# Patient Record
Sex: Male | Born: 1955 | Race: Black or African American | Hispanic: No | Marital: Married | Smoking: Current every day smoker
Health system: Southern US, Community
[De-identification: ages and names within clinical notes are randomized; demographics above are authoritative.]

## PROBLEM LIST (undated history)

## (undated) DIAGNOSIS — R200 Anesthesia of skin: Secondary | ICD-10-CM

## (undated) DIAGNOSIS — M1990 Unspecified osteoarthritis, unspecified site: Secondary | ICD-10-CM

## (undated) DIAGNOSIS — I10 Essential (primary) hypertension: Secondary | ICD-10-CM

## (undated) HISTORY — PX: BACK SURGERY: SHX140

## (undated) HISTORY — PX: COLONOSCOPY W/ BIOPSIES AND POLYPECTOMY: SHX1376

## (undated) HISTORY — PX: KNEE ARTHROSCOPY: SUR90

---
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
## 1980-11-15 HISTORY — PX: KNEE SURGERY: SHX244

## 2009-08-04 ENCOUNTER — Ambulatory Visit: Payer: Self-pay | Admitting: Gastroenterology

## 2010-03-12 ENCOUNTER — Ambulatory Visit: Payer: Self-pay | Admitting: Family Medicine

## 2012-02-15 ENCOUNTER — Ambulatory Visit: Payer: Self-pay | Admitting: Internal Medicine

## 2012-04-06 ENCOUNTER — Ambulatory Visit: Payer: Self-pay | Admitting: Pain Medicine

## 2012-04-24 ENCOUNTER — Emergency Department: Payer: Self-pay | Admitting: Emergency Medicine

## 2012-04-24 LAB — BASIC METABOLIC PANEL
Anion Gap: 11 (ref 7–16)
BUN: 21 mg/dL — ABNORMAL HIGH (ref 7–18)
Chloride: 101 mmol/L (ref 98–107)
Co2: 24 mmol/L (ref 21–32)
Creatinine: 1.41 mg/dL — ABNORMAL HIGH (ref 0.60–1.30)
Osmolality: 276 (ref 275–301)
Potassium: 4.3 mmol/L (ref 3.5–5.1)

## 2012-04-24 LAB — CK TOTAL AND CKMB (NOT AT ARMC): CK-MB: 2.9 ng/mL (ref 0.5–3.6)

## 2012-06-13 ENCOUNTER — Ambulatory Visit: Payer: Self-pay | Admitting: Pain Medicine

## 2012-08-14 ENCOUNTER — Ambulatory Visit: Payer: Self-pay | Admitting: Pain Medicine

## 2012-09-07 ENCOUNTER — Ambulatory Visit: Payer: Self-pay | Admitting: Pain Medicine

## 2013-05-31 ENCOUNTER — Ambulatory Visit: Payer: Self-pay | Admitting: Physical Medicine and Rehabilitation

## 2013-07-12 ENCOUNTER — Other Ambulatory Visit: Payer: Self-pay | Admitting: Neurosurgery

## 2013-07-30 ENCOUNTER — Encounter (HOSPITAL_COMMUNITY): Payer: Self-pay | Admitting: Pharmacy Technician

## 2013-07-31 ENCOUNTER — Encounter (HOSPITAL_COMMUNITY)
Admission: RE | Admit: 2013-07-31 | Discharge: 2013-07-31 | Disposition: A | Discharge status: Home / Self Care | Payer: 59 | Source: Ambulatory Visit | Attending: Anesthesiology | Admitting: Anesthesiology

## 2013-07-31 ENCOUNTER — Encounter (HOSPITAL_COMMUNITY)
Admission: RE | Admit: 2013-07-31 | Discharge: 2013-07-31 | Disposition: A | Discharge status: Home / Self Care | Payer: 59 | Source: Ambulatory Visit | Attending: Neurosurgery | Admitting: Neurosurgery

## 2013-07-31 ENCOUNTER — Encounter (HOSPITAL_COMMUNITY): Payer: Self-pay

## 2013-07-31 DIAGNOSIS — Z01810 Encounter for preprocedural cardiovascular examination: Secondary | ICD-10-CM | POA: Insufficient documentation

## 2013-07-31 DIAGNOSIS — Z01818 Encounter for other preprocedural examination: Secondary | ICD-10-CM | POA: Insufficient documentation

## 2013-07-31 DIAGNOSIS — Z01812 Encounter for preprocedural laboratory examination: Secondary | ICD-10-CM | POA: Insufficient documentation

## 2013-07-31 HISTORY — DX: Essential (primary) hypertension: I10

## 2013-07-31 HISTORY — DX: Anesthesia of skin: R20.0

## 2013-07-31 LAB — TYPE AND SCREEN
ABO/RH(D): O POS
Antibody Screen: NEGATIVE

## 2013-07-31 LAB — BASIC METABOLIC PANEL
Calcium: 9.3 mg/dL (ref 8.4–10.5)
Creatinine, Ser: 2.39 mg/dL — ABNORMAL HIGH (ref 0.50–1.35)
GFR calc Af Amer: 33 mL/min — ABNORMAL LOW (ref >90)
GFR calc non Af Amer: 29 mL/min — ABNORMAL LOW (ref >90)
Sodium: 136 mEq/L (ref 135–145)

## 2013-07-31 LAB — CBC
Platelets: 243 10*3/uL (ref 150–400)
RBC: 3.52 MIL/uL — ABNORMAL LOW (ref 4.22–5.81)
RDW: 12.7 % (ref 11.5–15.5)
WBC: 7.8 10*3/uL (ref 4.0–10.5)

## 2013-07-31 LAB — SURGICAL PCR SCREEN
MRSA, PCR: NEGATIVE
Staphylococcus aureus: NEGATIVE

## 2013-07-31 LAB — ABO/RH: ABO/RH(D): O POS

## 2013-07-31 NOTE — Pre-Procedure Instructions (Signed)
Zyere Umbaugh  07/31/2013   Your procedure is scheduled on:  Wednesday, August 08, 2013  Report to [facility] Short Stay Center at 9:45 AM.  Call this number if you have problems the morning of surgery: [phone]   Remember:   Do not eat food or drink liquids after midnight.   Take these medicines the morning of surgery with A SIP OF WATER: If needed:oxycodone (ROXICODONE) 30 MG immediate release tablet Stop taking Aspirin and herbal medications. Do not take any NSAIDs ie: Ibuprofen, Advil, Naproxen or any medication containing Aspirin.  Do not wear jewelry, make-up or nail polish.  Do not wear lotions, powders, or perfumes. You may wear deodorant.  Do not shave 48 hours prior to surgery. Men may shave face and neck.  Do not bring valuables to the hospital.  [facility] is not responsible for any belongings or valuables.  Contacts, dentures or bridgework may not be worn into surgery.  Leave suitcase in the car. After surgery it may be brought to your room.  For patients admitted to the hospital, checkout time is 11:00 AM the day of discharge.   Patients discharged the day of surgery will not be allowed to drive home.  Name and phone number of your driver:   Special Instructions: Shower using CHG 2 nights before surgery and the night before surgery.  If you shower the day of surgery use CHG.  Use special wash - you have one bottle of CHG for all showers.  You should use approximately 1/3 of the bottle for each shower.   Please read over the following fact sheets that you were given: Pain Booklet, Coughing and Deep Breathing, Blood Transfusion Information, MRSA Information and Surgical Site Infection Prevention

## 2013-07-31 NOTE — Progress Notes (Signed)
Pt denies SOB, chest pain, and being under the care of a cardiologist. Pt denies having a chest x ray and EKG within the last year. Pt states that he had a stress test in 2012 at [facility];results were requested along with latest office notes, any EKG and chest x ray results. Pt had a positive result on the STOP-BANG Assessment Tool. Dr. Dareen Piano (PCP) of [facility] was notified of results.

## 2013-07-31 NOTE — Progress Notes (Signed)
07/31/13 0938  OBSTRUCTIVE SLEEP APNEA  Have you ever been diagnosed with sleep apnea through a sleep study? No  Do you snore loudly (loud enough to be heard through closed doors)?  1  Do you often feel tired, fatigued, or sleepy during the daytime? 1  Has anyone observed you stop breathing during your sleep? 0  Do you have, or are you being treated for high blood pressure? 1  BMI more than 35 kg/m2? 0  Age over 55 years old? 1  Neck circumference greater than 40 cm/18 inches? 0  Gender: 1  Obstructive Sleep Apnea Score 5  Score 4 or greater  Results sent to PCP

## 2013-08-01 ENCOUNTER — Encounter (HOSPITAL_COMMUNITY): Payer: Self-pay | Admitting: Vascular Surgery

## 2013-08-01 NOTE — Progress Notes (Signed)
Anesthesia Chart Review:  Patient is a 57 year old male scheduled for L4-5 PLIF on 08/08/13 by Dr. Jeral Fruit.  History includes smoking, HTN.  PCP is Dr. Einar Crow at [facility].  Meds: Lisinopril/HCTZ, oxycodone.    EKG on 07/31/13 showed SB @ 52 bpm.  Nuclear stress test on 02/15/12 [facility]) showed negative scan, EF 70%.  CXR on 07/31/13 showed no acute abnormality noted.  Preoperative labs noted.  BUN/Cr 32/2.39.  I received comparison labs from Dr. Ewell Poe office from 04/23/13 that showed a BUN/Cr of 18/1.2.  With his Cr doubling in three months, I think he will need medical clearance.  I notified Jessica at Dr. Cassandria Santee office.  She will notify Dr. Ewell Poe office of lab results and request recommendations for clearance.  If he has not had a BMET repeated then he will need one repeated preoperatively.  I'll follow-up once I receive additional information/recommendations.  Velna Ochs [facility] Short Stay Center/Anesthesiology Phone [phone] 08/02/2013 9:24 AM

## 2013-08-08 ENCOUNTER — Encounter (HOSPITAL_COMMUNITY): Admission: RE | Payer: Self-pay | Source: Ambulatory Visit

## 2013-08-08 ENCOUNTER — Inpatient Hospital Stay (HOSPITAL_COMMUNITY): Admission: RE | Admit: 2013-08-08 | Payer: 59 | Source: Ambulatory Visit | Admitting: Neurosurgery

## 2013-08-08 SURGERY — POSTERIOR LUMBAR FUSION 1 LEVEL
Anesthesia: General

## 2014-03-05 ENCOUNTER — Ambulatory Visit: Payer: Self-pay | Admitting: Nephrology

## 2014-05-22 ENCOUNTER — Emergency Department: Payer: Self-pay | Admitting: Emergency Medicine

## 2014-05-22 LAB — COMPREHENSIVE METABOLIC PANEL
ALBUMIN: 3.7 g/dL (ref 3.4–5.0)
AST: 27 U/L (ref 15–37)
Alkaline Phosphatase: 68 U/L
Anion Gap: 6 — ABNORMAL LOW (ref 7–16)
BILIRUBIN TOTAL: 0.6 mg/dL (ref 0.2–1.0)
BUN: 16 mg/dL (ref 7–18)
CALCIUM: 8.6 mg/dL (ref 8.5–10.1)
CO2: 28 mmol/L (ref 21–32)
CREATININE: 1.15 mg/dL (ref 0.60–1.30)
Chloride: 103 mmol/L (ref 98–107)
EGFR (Non-African Amer.): >60
GLUCOSE: 123 mg/dL — AB (ref 65–99)
Osmolality: 276 (ref 275–301)
POTASSIUM: 4 mmol/L (ref 3.5–5.1)
SGPT (ALT): 39 U/L (ref 12–78)
Sodium: 137 mmol/L (ref 136–145)
Total Protein: 7.9 g/dL (ref 6.4–8.2)

## 2014-05-22 LAB — TSH: THYROID STIMULATING HORM: 0.9 u[IU]/mL

## 2014-05-22 LAB — CBC
HCT: 45.7 % (ref 40.0–52.0)
HGB: 14.9 g/dL (ref 13.0–18.0)
MCH: 34 pg (ref 26.0–34.0)
MCHC: 32.6 g/dL (ref 32.0–36.0)
MCV: 104 fL — AB (ref 80–100)
PLATELETS: 257 10*3/uL (ref 150–440)
RBC: 4.38 10*6/uL — ABNORMAL LOW (ref 4.40–5.90)
RDW: 12.7 % (ref 11.5–14.5)
WBC: 7.2 10*3/uL (ref 3.8–10.6)

## 2014-05-22 LAB — T4, FREE: Free Thyroxine: 0.95 ng/dL (ref 0.76–1.46)

## 2014-05-22 LAB — LIPASE, BLOOD: Lipase: 141 U/L (ref 73–393)

## 2014-05-22 LAB — TROPONIN I: Troponin-I: <0.02 ng/mL

## 2014-07-12 ENCOUNTER — Ambulatory Visit: Payer: Self-pay | Admitting: Gastroenterology

## 2014-07-15 LAB — PATHOLOGY REPORT

## 2014-12-16 ENCOUNTER — Ambulatory Visit (INDEPENDENT_AMBULATORY_CARE_PROVIDER_SITE_OTHER): Payer: 59 | Admitting: Neurology

## 2014-12-16 ENCOUNTER — Encounter: Payer: Self-pay | Admitting: Neurology

## 2014-12-16 VITALS — BP 138/82 | HR 75 | Ht 74.0 in | Wt 220.0 lb

## 2014-12-16 DIAGNOSIS — M4806 Spinal stenosis, lumbar region: Secondary | ICD-10-CM

## 2014-12-16 DIAGNOSIS — M5416 Radiculopathy, lumbar region: Secondary | ICD-10-CM | POA: Insufficient documentation

## 2014-12-16 DIAGNOSIS — M48062 Spinal stenosis, lumbar region with neurogenic claudication: Secondary | ICD-10-CM

## 2014-12-16 MED ORDER — GABAPENTIN 300 MG PO CAPS: 300.0000 mg | ORAL_CAPSULE | Freq: Three times a day (TID) | ORAL | Status: DC

## 2014-12-16 NOTE — Patient Instructions (Signed)
Overall you are doing fairly well but I do want to suggest a few things today:   Remember to drink plenty of fluid, eat healthy meals and do not skip any meals. Try to eat protein with a every meal and eat a healthy snack such as fruit or nuts in between meals. Try to keep a regular sleep-wake schedule and try to exercise daily, particularly in the form of walking, 20-30 minutes a day, if you can.   As far as your medications are concerned, I would like to suggest: Neurontin 300mg  three times a day, may cause drowsiness. May also consider epidural lumbar injections after imaging  As far as diagnostic testing: MRi of the Lumbar spine, Physical Therapy  My clinical assistant and will answer any of your questions and relay your messages to me and also relay most of my messages to you.   Our phone number is [phone]. We also have an after hours call service for urgent matters and there is a physician on-call for urgent questions. For any emergencies you know to call 911 or go to the nearest emergency room

## 2014-12-16 NOTE — Progress Notes (Addendum)
[facility]    Provider:  Dr Lucia GaskinsAhern Referring Provider: Lauro RegulusAnderson, Marshall W, MD Primary Care Physician:  Pcp Not In System  CC:  Back pain  HPI:  [name] Maudry MayhewLeath Jr. is a 59 y.o. male here as a referral from Dr. Dareen PianoAnderson for spinal stenosis. PMHx HTN,CKD,  lumbar radiculopathy, spinal stenosis. For the last 5 years he has had worsening back pain. He was seen by Dr. Jeral FruitBotero and was hoping to get surgery but insurance declined. He is having back spasms, like someone took a wire brush and put it in his rectum, numbness around the buttocks and genitalia. He has pain that radiates into the posterior thighs, like a jolt. Worse at work because he is bending up and down. Worse when sitting too long, has to get up and walk. Right leg worse than the left. Worse with valsalva. Dr. Jeral FruitBotero gave him Oxy 10 and that didn't really hel[p. Nothing seems to help, tried advil and tylenol. Pain gets to "12/10" and is daily, some days he can't even make it to work or he has to leave early due to back pain. No urinary or bowel incontinence or retension. No inciting factors, no trauma or falls, started slowly and progressed slowly. He can't walk more than a block or he needs to sit down. Has had injections, pain control without relief.   Reviewed notes, labs and imaging from outside physicians, which showed: CBC with mild anemia 12.1/34.8, BMP with creatinine 2.39, BUN 32. Per notes, he was scheduled for L4-L5 PLIF in 07/2013 by Dr. Jeral FruitBotero. Notes state that creatinine of 2.39 in 2014 was double from previous labs and they requested medical clearance. Nuclear stress test 02/2012 negative scan, EF 70%, cxr 07/2013 without abnormality. No further notes or information to state why he did not proceed with surgery however patient reports it was an insurnce problem.    Review of Systems: Patient complains of symptoms per HPI as well as the following symptoms: rash, no SOB, no CP. Pertinent negatives per HPI. All  others negative.   History   Social History  . Marital Status: Single    Spouse Name: N/A    Number of Children: 2  . Years of Education: College 1y   Occupational History  .  Other    [employer]   Social History Main Topics  . Smoking status: Current Some Day Smoker -- 3.00 packs/day    Types: Cigars, Cigarettes  . Smokeless tobacco: Never Used     Comment: 6 pack on weekends   . Alcohol Use: 0.0 oz/week    0 Not specified per week     Comment: "occasional beer"  . Drug Use: No  . Sexual Activity: Not on file   Other Topics Concern  . Not on file   Social History Narrative   Lives at home with a friend.    Work at [employer].    1 year of college.   Has 2 children.   Caffeine Use:     Family History  Problem Relation Age of Onset  . Sickle cell trait Sister   . High blood pressure Mother   . Cancer Father     Past Medical History  Diagnosis Date  . Hypertension   . Numbness     Hx: of left hand third digit    Past Surgical History  Procedure Laterality Date  . Colonoscopy w/ biopsies and polypectomy      Hx; of  . Knee surgery Left  1978  . Knee surgery Right 1982    Current Outpatient Prescriptions  Medication Sig Dispense Refill  . Cholecalciferol 1000 UNITS capsule Take 1 capsule by mouth daily.    Marland Kitchen lisinopril-hydrochlorothiazide (PRINZIDE,ZESTORETIC) 20-12.5 MG per tablet Take 1 tablet by mouth daily.    . tadalafil (CIALIS) 20 MG tablet Take 1 tablet by mouth daily.    Marland Kitchen gabapentin (NEURONTIN) 300 MG capsule Take 1 capsule (300 mg total) by mouth 3 (three) times daily. 90 capsule 11   No current facility-administered medications for this visit.    Allergies as of 12/16/2014  . (No Known Allergies)    Vitals: BP 138/82 mmHg  Pulse 75  Ht  (1.88 m)  Wt 220 lb (99.791 kg)  BMI 28.23 kg/m2 Last Weight:  Wt Readings from Last 1 Encounters:  12/16/14 220 lb (99.791 kg)   Last Height:   Ht Readings from Last 1  Encounters:  12/16/14  (1.88 m)   Physical exam: Exam: Gen: NAD, conversant, well nourised,                      CV: RRR, no MRG. No Carotid Bruits. No peripheral edema, warm, nontender Eyes: Conjunctivae clear without exudates or hemorrhage  Neuro: Detailed Neurologic Exam  Speech:    Speech is normal; fluent and spontaneous with normal comprehension.  Cognition:    The patient is oriented to person, place, and time;     recent and remote memory intact;     language fluent;     normal attention, concentration,     fund of knowledge Cranial Nerves:    The pupils are equal, round, and reactive to light. The fundi are normal and spontaneous venous pulsations are present. Visual fields are full to finger confrontation. Extraocular movements are intact. Trigeminal sensation is intact and the muscles of mastication are normal. The face is symmetric. The palate elevates in the midline. Hearing intact. Voice is normal. Shoulder shrug is normal. The tongue has normal motion without fasciculations.   Coordination:    Normal finger to nose and heel to shin.   Gait:    Heel-toe and tandem gait are normal.   Motor Observation:    No asymmetry, no atrophy, and no involuntary movements noted. Tone:    Normal muscle tone.    Posture:    Posture is normal. normal erect    Strength:    Strength is V/V in the upper and lower limbs.      Sensation: intact to LT     Reflex Exam:  DTR's: Hyporeflexic achilles otherwise deep tendon reflexes in the upper and lower extremities are normal bilaterally.   Toes:    The toes are equivocal bilaterally.   Clonus:    Clonus is absent.    Assessment/Plan:  59 year old male with HTN, Lumbar radiculopathy with neurogenic claudication. Worsening, right > left leg. Last MRI L spine 2014.   Repeat MRI of the L spine Neurontin prn Physical Therapy Patient had MRI of the L-spine done at outside facility in 2014, have requested medical records  and report  Naomie Dean, MD  [facility] [address] [place], [place] [address]  Phone [phone] Fax [phone]

## 2014-12-19 ENCOUNTER — Telehealth: Payer: Self-pay | Admitting: Neurology

## 2014-12-19 ENCOUNTER — Other Ambulatory Visit: Payer: Self-pay | Admitting: Neurology

## 2014-12-19 DIAGNOSIS — Z79899 Other long term (current) drug therapy: Secondary | ICD-10-CM

## 2014-12-19 DIAGNOSIS — N289 Disorder of kidney and ureter, unspecified: Secondary | ICD-10-CM

## 2014-12-19 NOTE — Telephone Encounter (Signed)
Great.  Thanks

## 2014-12-19 NOTE — Telephone Encounter (Signed)
Called patient. Left vmail on mobile# to return call.

## 2014-12-19 NOTE — Telephone Encounter (Signed)
Pt is calling stating that gabapentin (NEURONTIN) 300 MG capsule is not working for him.  He has had no relief at all.  Please call and advise.

## 2014-12-19 NOTE — Telephone Encounter (Signed)
[name] [name] - Can you call patient and let him know that before I can increase the dose of neurontin, I need him tpo have labs drawn so i can check his kidney function. I ordered the lab, thanks.

## 2014-12-19 NOTE — Telephone Encounter (Signed)
Pt called back and I informed him that before Dr. Lucia GaskinsAhern could increase neurontin he needed to come in and have labs drawn to check his kidney function, pt verbalized understanding and said he will come in one day next week and he would call first before coming to have labs drawn.

## 2015-01-02 ENCOUNTER — Telehealth: Payer: Self-pay | Admitting: *Deleted

## 2015-01-02 ENCOUNTER — Ambulatory Visit: Payer: Self-pay

## 2015-01-02 NOTE — Telephone Encounter (Signed)
Report from [facility] receive on 01/02/15.

## 2015-01-09 ENCOUNTER — Telehealth: Payer: Self-pay | Admitting: *Deleted

## 2015-01-09 NOTE — Telephone Encounter (Signed)
[name] [name] - we need to fax a request to the [facility], they are apparently not part [facility]. Please explain this to patient, even though [name] is [facility] the imaging center is not. Can you fill out a request and in place of the patient signature put "patient verbal approval to Dr. Lucia GaskinsAhern". Thank you. I won't be here tomorrow or Monday so please let medical records know that you will be waiting for the report and if unremarkable you can call and relay information to patient. Thank you.

## 2015-01-09 NOTE — Telephone Encounter (Signed)
Patient calling wanting MRI results. Please advise.

## 2015-01-09 NOTE — Telephone Encounter (Signed)
Talked with patient and he said he had his MRI completed at [facility] on 01/02/15. I told him the results were no ready yet and we will call once they are ready. Patient verbalized understanding.

## 2015-01-09 NOTE — Telephone Encounter (Signed)
Left a voicemail for the patient to call us back. Gave [facility] phone number and office hours.  

## 2015-01-09 NOTE — Telephone Encounter (Signed)
Would you let patient know we don't have the results yet. When did he have it done and where? It usually takes a few days for the formal neuroradiologist report, if it comes from an outside facility maybe longer as they have to send us the disk. We will call with results.

## 2015-01-10 ENCOUNTER — Telehealth: Payer: Self-pay | Admitting: *Deleted

## 2015-01-10 NOTE — Telephone Encounter (Signed)
Patient returning call.

## 2015-01-10 NOTE — Telephone Encounter (Signed)
Faxed request over to [facility].

## 2015-01-10 NOTE — Telephone Encounter (Signed)
Talked with patient and he gave a verbal consent that it was okay to have MRI report sent from [facility] to [facility]. Patient verbalized understanding.

## 2015-01-13 ENCOUNTER — Telehealth: Payer: Self-pay | Admitting: *Deleted

## 2015-01-13 NOTE — Telephone Encounter (Signed)
Receive report from   Imaging on 01/13/15.

## 2015-01-14 ENCOUNTER — Telehealth: Payer: Self-pay | Admitting: *Deleted

## 2015-01-16 NOTE — Telephone Encounter (Signed)
Called again. No answer, no identifying information the voice mail. Left message. Will try again tomorrow.

## 2015-01-17 NOTE — Telephone Encounter (Signed)
Patient called and stated he has MRI cd and would like a return call @ # [phone].  Please call and advise.

## 2015-01-19 ENCOUNTER — Other Ambulatory Visit: Payer: Self-pay | Admitting: Neurology

## 2015-01-19 DIAGNOSIS — M48061 Spinal stenosis, lumbar region without neurogenic claudication: Secondary | ICD-10-CM

## 2015-01-19 DIAGNOSIS — M4800 Spinal stenosis, site unspecified: Secondary | ICD-10-CM

## 2015-01-19 NOTE — Telephone Encounter (Signed)
Left messsage for patient. MRi shows Severe central and bilateral foraminal stenosis at L4-L5 with progressive facet hypertrophy, dynamic anterolisthesis. Will refer to orthopaedics for evaluation an treatment. Patient will bring CD with MRi of the lumbar spine with him to appointment.

## 2015-05-23 ENCOUNTER — Other Ambulatory Visit (HOSPITAL_COMMUNITY): Payer: Self-pay | Admitting: *Deleted

## 2015-05-23 NOTE — Pre-Procedure Instructions (Signed)
    Kyran Maudry MayhewLeath Jr.  05/23/2015     Your procedure is scheduled on Wednesday, June 04, 2015 at 8:30 AM.   Report to [facility] Entrance "A" Admitting Office at 6:30 AM.   Call this number if you have problems the morning of surgery: [phone]   Any questions prior to day of surgery, please call [phone] between 8 & 4 PM.    Remember:  Do not eat food or drink liquids after midnight Tuesday, 06/03/15.  Take these medicines the morning of surgery with A SIP OF WATER: Gabapentin (Neurontin)   Do not wear jewelry.  Do not wear lotions, powders, or cologne.  You may wear deodorant.  Men may shave face and neck.  Do not bring valuables to the hospital.  [facility] is not responsible for any belongings or valuables.  Contacts, dentures or bridgework may not be worn into surgery.  Leave your suitcase in the car.  After surgery it may be brought to your room.  For patients admitted to the hospital, discharge time will be determined by your treatment team.  Special instructions:  See "Preparing for Surgery" Instruction sheet.   Please read over the following fact sheets that you were given. Pain Booklet, Coughing and Deep Breathing, Blood Transfusion Information, MRSA Information and Surgical Site Infection Prevention

## 2015-05-26 ENCOUNTER — Encounter (HOSPITAL_COMMUNITY)
Admission: RE | Admit: 2015-05-26 | Discharge: 2015-05-26 | Disposition: A | Discharge status: Home / Self Care | Payer: PRIVATE HEALTH INSURANCE | Source: Ambulatory Visit | Attending: Orthopedic Surgery | Admitting: Orthopedic Surgery

## 2015-05-26 ENCOUNTER — Encounter (HOSPITAL_COMMUNITY): Payer: Self-pay

## 2015-05-26 DIAGNOSIS — R001 Bradycardia, unspecified: Secondary | ICD-10-CM | POA: Diagnosis not present

## 2015-05-26 DIAGNOSIS — I10 Essential (primary) hypertension: Secondary | ICD-10-CM | POA: Diagnosis not present

## 2015-05-26 DIAGNOSIS — Z0183 Encounter for blood typing: Secondary | ICD-10-CM | POA: Diagnosis not present

## 2015-05-26 DIAGNOSIS — Z01812 Encounter for preprocedural laboratory examination: Secondary | ICD-10-CM | POA: Diagnosis present

## 2015-05-26 HISTORY — DX: Unspecified osteoarthritis, unspecified site: M19.90

## 2015-05-26 LAB — CBC
HEMATOCRIT: 42.5 % (ref 39.0–52.0)
HEMOGLOBIN: 15.1 g/dL (ref 13.0–17.0)
MCH: 35.8 pg — AB (ref 26.0–34.0)
MCHC: 35.5 g/dL (ref 30.0–36.0)
MCV: 100.7 fL — AB (ref 78.0–100.0)
Platelets: 234 10*3/uL (ref 150–400)
RBC: 4.22 MIL/uL (ref 4.22–5.81)
RDW: 12.5 % (ref 11.5–15.5)
WBC: 7 10*3/uL (ref 4.0–10.5)

## 2015-05-26 LAB — BASIC METABOLIC PANEL
Anion gap: 5 (ref 5–15)
BUN: 17 mg/dL (ref 6–20)
CO2: 26 mmol/L (ref 22–32)
Calcium: 9.1 mg/dL (ref 8.9–10.3)
Chloride: 108 mmol/L (ref 101–111)
Creatinine, Ser: 1.04 mg/dL (ref 0.61–1.24)
GFR calc Af Amer: >60 mL/min (ref >60)
GFR calc non Af Amer: >60 mL/min (ref >60)
GLUCOSE: 94 mg/dL (ref 65–99)
Potassium: 4.3 mmol/L (ref 3.5–5.1)
Sodium: 139 mmol/L (ref 135–145)

## 2015-05-26 LAB — TYPE AND SCREEN
ABO/RH(D): O POS
ANTIBODY SCREEN: NEGATIVE

## 2015-05-26 LAB — SURGICAL PCR SCREEN
MRSA, PCR: NEGATIVE
Staphylococcus aureus: NEGATIVE

## 2015-06-03 MED ORDER — CEFAZOLIN SODIUM-DEXTROSE 2-3 GM-% IV SOLR
2.0000 g | INTRAVENOUS | Status: AC
  Administered 2015-06-04: 2 g via INTRAVENOUS
  Filled 2015-06-03: qty 50

## 2015-06-03 MED ORDER — ACETAMINOPHEN 10 MG/ML IV SOLN
1000.0000 mg | INTRAVENOUS | Status: AC
  Administered 2015-06-04: 1000 mg via INTRAVENOUS
  Filled 2015-06-03: qty 100

## 2015-06-03 NOTE — Anesthesia Preprocedure Evaluation (Addendum)
Anesthesia Evaluation  Patient identified by MRN, date of birth, ID band Patient awake    Reviewed: Allergy & Precautions, NPO status , Patient's Chart, lab work & pertinent test results, reviewed documented beta blocker date and time   History of Anesthesia Complications Negative for: history of anesthetic complications  Airway Mallampati: II  TM Distance: >3 FB Neck ROM: Full    Dental  (+) Partial Lower, Dental Advisory Given   Pulmonary former smoker (Quit 03/2015),  breath sounds clear to auscultation        Cardiovascular hypertension, Pt. on medications Rhythm:Regular Rate:Normal  EKG WNL 05/2015   Neuro/Psych negative neurological ROS  negative psych ROS   GI/Hepatic negative GI ROS, Neg liver ROS,   Endo/Other  negative endocrine ROS  Renal/GU Renal InsufficiencyRenal diseaseStage 3 RI     Musculoskeletal  (+) Arthritis -, Osteoarthritis,    Abdominal (+)  Abdomen: soft.    Peds  Hematology 15/42   Anesthesia Other Findings   Reproductive/Obstetrics                           Anesthesia Physical Anesthesia Plan  ASA: II  Anesthesia Plan: General   Post-op Pain Management:    Induction: Intravenous  Airway Management Planned: Oral ETT  Additional Equipment:   Intra-op Plan:   Post-operative Plan: Extubation in OR  Informed Consent: I have reviewed the patients History and Physical, chart, labs and discussed the procedure including the risks, benefits and alternatives for the proposed anesthesia with the patient or authorized representative who has indicated his/her understanding and acceptance.     Plan Discussed with:   Anesthesia Plan Comments: (2nd IV after turning, multimodal pain RX)        Anesthesia Quick Evaluation

## 2015-06-04 ENCOUNTER — Inpatient Hospital Stay (HOSPITAL_COMMUNITY): Payer: PRIVATE HEALTH INSURANCE | Admitting: Anesthesiology

## 2015-06-04 ENCOUNTER — Inpatient Hospital Stay (HOSPITAL_COMMUNITY): Payer: PRIVATE HEALTH INSURANCE

## 2015-06-04 ENCOUNTER — Encounter (HOSPITAL_COMMUNITY): Payer: Self-pay | Admitting: *Deleted

## 2015-06-04 ENCOUNTER — Inpatient Hospital Stay (HOSPITAL_COMMUNITY)
Admission: RE | Admit: 2015-06-04 | Discharge: 2015-06-06 | DRG: 460 | Disposition: A | Discharge status: Home / Self Care | Payer: PRIVATE HEALTH INSURANCE | Source: Ambulatory Visit | Attending: Orthopedic Surgery | Admitting: Orthopedic Surgery

## 2015-06-04 ENCOUNTER — Encounter (HOSPITAL_COMMUNITY)
Admission: RE | Disposition: A | Discharge status: Home / Self Care | Payer: Self-pay | Source: Ambulatory Visit | Attending: Orthopedic Surgery

## 2015-06-04 DIAGNOSIS — F17210 Nicotine dependence, cigarettes, uncomplicated: Secondary | ICD-10-CM | POA: Diagnosis present

## 2015-06-04 DIAGNOSIS — M545 Low back pain: Secondary | ICD-10-CM | POA: Diagnosis present

## 2015-06-04 DIAGNOSIS — Z8261 Family history of arthritis: Secondary | ICD-10-CM | POA: Diagnosis not present

## 2015-06-04 DIAGNOSIS — I10 Essential (primary) hypertension: Secondary | ICD-10-CM | POA: Diagnosis present

## 2015-06-04 DIAGNOSIS — M4316 Spondylolisthesis, lumbar region: Principal | ICD-10-CM | POA: Diagnosis present

## 2015-06-04 DIAGNOSIS — Z79899 Other long term (current) drug therapy: Secondary | ICD-10-CM | POA: Diagnosis not present

## 2015-06-04 DIAGNOSIS — M4326 Fusion of spine, lumbar region: Secondary | ICD-10-CM

## 2015-06-04 DIAGNOSIS — Z419 Encounter for procedure for purposes other than remedying health state, unspecified: Secondary | ICD-10-CM

## 2015-06-04 DIAGNOSIS — M4806 Spinal stenosis, lumbar region: Secondary | ICD-10-CM | POA: Diagnosis present

## 2015-06-04 DIAGNOSIS — M549 Dorsalgia, unspecified: Secondary | ICD-10-CM | POA: Diagnosis present

## 2015-06-04 DIAGNOSIS — Z8249 Family history of ischemic heart disease and other diseases of the circulatory system: Secondary | ICD-10-CM | POA: Diagnosis not present

## 2015-06-04 SURGERY — POSTERIOR LUMBAR FUSION 1 LEVEL
Anesthesia: General | Site: Back

## 2015-06-04 MED ORDER — ALBUMIN HUMAN 5 % IV SOLN
INTRAVENOUS | Status: DC | PRN
  Administered 2015-06-04: 11:00:00 via INTRAVENOUS

## 2015-06-04 MED ORDER — LACTATED RINGERS IV SOLN
INTRAVENOUS | Status: DC
Start: 2015-06-04 — End: 2015-06-06

## 2015-06-04 MED ORDER — MAGNESIUM CITRATE PO SOLN
0.5000 | Freq: Once | ORAL | Status: AC
  Administered 2015-06-04: 0.5 via ORAL
  Filled 2015-06-04: qty 296

## 2015-06-04 MED ORDER — SUCCINYLCHOLINE CHLORIDE 20 MG/ML IJ SOLN
INTRAMUSCULAR | Status: AC
  Filled 2015-06-04: qty 1

## 2015-06-04 MED ORDER — 0.9 % SODIUM CHLORIDE (POUR BTL) OPTIME
TOPICAL | Status: DC | PRN
  Administered 2015-06-04 (×2): 1000 mL

## 2015-06-04 MED ORDER — METHOCARBAMOL 1000 MG/10ML IJ SOLN: 500.0000 mg | Freq: Four times a day (QID) | INTRAVENOUS | Status: DC | PRN

## 2015-06-04 MED ORDER — ONDANSETRON HCL 4 MG/2ML IJ SOLN: 4.0000 mg | INTRAMUSCULAR | Status: DC | PRN

## 2015-06-04 MED ORDER — FENTANYL CITRATE (PF) 100 MCG/2ML IJ SOLN
INTRAMUSCULAR | Status: DC | PRN
  Administered 2015-06-04 (×2): 50 ug via INTRAVENOUS
  Administered 2015-06-04: 100 ug via INTRAVENOUS
  Administered 2015-06-04 (×4): 50 ug via INTRAVENOUS

## 2015-06-04 MED ORDER — MEPERIDINE HCL 25 MG/ML IJ SOLN: 6.2500 mg | INTRAMUSCULAR | Status: DC | PRN

## 2015-06-04 MED ORDER — THROMBIN 20000 UNITS EX SOLR
CUTANEOUS | Status: AC
  Filled 2015-06-04: qty 20000

## 2015-06-04 MED ORDER — PROPOFOL INFUSION 10 MG/ML OPTIME
INTRAVENOUS | Status: DC | PRN
  Administered 2015-06-04: 75 ug/kg/min via INTRAVENOUS
  Administered 2015-06-04: 50 ug/kg/min via INTRAVENOUS

## 2015-06-04 MED ORDER — PROMETHAZINE HCL 25 MG/ML IJ SOLN: 6.2500 mg | INTRAMUSCULAR | Status: DC | PRN

## 2015-06-04 MED ORDER — HEMOSTATIC AGENTS (NO CHARGE) OPTIME
TOPICAL | Status: DC | PRN
  Administered 2015-06-04: 1 via TOPICAL

## 2015-06-04 MED ORDER — LIDOCAINE HCL (CARDIAC) 20 MG/ML IV SOLN
INTRAVENOUS | Status: DC | PRN
  Administered 2015-06-04: 100 mg via INTRAVENOUS

## 2015-06-04 MED ORDER — ACETAMINOPHEN 500 MG PO TABS
1000.0000 mg | ORAL_TABLET | Freq: Four times a day (QID) | ORAL | Status: AC
  Administered 2015-06-04 – 2015-06-05 (×3): 1000 mg via ORAL
  Filled 2015-06-04 (×3): qty 2

## 2015-06-04 MED ORDER — OXYCODONE HCL 5 MG PO TABS
10.0000 mg | ORAL_TABLET | ORAL | Status: DC | PRN
  Administered 2015-06-04 – 2015-06-06 (×11): 10 mg via ORAL
  Filled 2015-06-04 (×10): qty 2

## 2015-06-04 MED ORDER — MENTHOL 3 MG MT LOZG: 1.0000 | LOZENGE | OROMUCOSAL | Status: DC | PRN

## 2015-06-04 MED ORDER — HYDROMORPHONE HCL 1 MG/ML IJ SOLN: 0.2500 mg | INTRAMUSCULAR | Status: DC | PRN

## 2015-06-04 MED ORDER — LISINOPRIL 10 MG PO TABS
10.0000 mg | ORAL_TABLET | Freq: Every day | ORAL | Status: DC
  Administered 2015-06-04 – 2015-06-05 (×2): 10 mg via ORAL
  Filled 2015-06-04 (×3): qty 1

## 2015-06-04 MED ORDER — EPHEDRINE SULFATE 50 MG/ML IJ SOLN
INTRAMUSCULAR | Status: DC | PRN
  Administered 2015-06-04 (×2): 5 mg via INTRAVENOUS

## 2015-06-04 MED ORDER — METHOCARBAMOL 500 MG PO TABS
500.0000 mg | ORAL_TABLET | Freq: Four times a day (QID) | ORAL | Status: DC | PRN
Start: 2015-06-04 — End: 2015-06-06
  Administered 2015-06-04 – 2015-06-06 (×4): 500 mg via ORAL
  Filled 2015-06-04 (×3): qty 1

## 2015-06-04 MED ORDER — OXYCODONE HCL 5 MG PO TABS
ORAL_TABLET | ORAL | Status: AC
  Filled 2015-06-04: qty 2

## 2015-06-04 MED ORDER — FENTANYL CITRATE (PF) 250 MCG/5ML IJ SOLN
INTRAMUSCULAR | Status: AC
  Filled 2015-06-04: qty 5

## 2015-06-04 MED ORDER — LACTATED RINGERS IV SOLN
INTRAVENOUS | Status: DC | PRN
  Administered 2015-06-04 (×3): via INTRAVENOUS

## 2015-06-04 MED ORDER — DEXAMETHASONE SODIUM PHOSPHATE 10 MG/ML IJ SOLN
INTRAMUSCULAR | Status: AC
  Filled 2015-06-04: qty 1

## 2015-06-04 MED ORDER — THROMBIN 20000 UNITS EX SOLR
OROMUCOSAL | Status: DC | PRN
  Administered 2015-06-04: 20 mL via TOPICAL

## 2015-06-04 MED ORDER — ONDANSETRON HCL 4 MG/2ML IJ SOLN
INTRAMUSCULAR | Status: DC | PRN
  Administered 2015-06-04: 4 mg via INTRAVENOUS

## 2015-06-04 MED ORDER — CEFAZOLIN SODIUM 1-5 GM-% IV SOLN
1.0000 g | Freq: Three times a day (TID) | INTRAVENOUS | Status: AC
  Administered 2015-06-04 – 2015-06-05 (×2): 1 g via INTRAVENOUS
  Filled 2015-06-04 (×2): qty 50

## 2015-06-04 MED ORDER — SODIUM CHLORIDE 0.9 % IJ SOLN
3.0000 mL | INTRAMUSCULAR | Status: DC | PRN
Start: 2015-06-04 — End: 2015-06-06

## 2015-06-04 MED ORDER — LACTATED RINGERS IV SOLN
INTRAVENOUS | Status: DC | PRN
  Administered 2015-06-04: 09:00:00 via INTRAVENOUS

## 2015-06-04 MED ORDER — SUCCINYLCHOLINE CHLORIDE 20 MG/ML IJ SOLN
INTRAMUSCULAR | Status: DC | PRN
  Administered 2015-06-04: 120 mg via INTRAVENOUS

## 2015-06-04 MED ORDER — MORPHINE SULFATE 2 MG/ML IJ SOLN
1.0000 mg | INTRAMUSCULAR | Status: DC | PRN
  Administered 2015-06-04: 4 mg via INTRAVENOUS
  Filled 2015-06-04: qty 2

## 2015-06-04 MED ORDER — LIDOCAINE HCL (CARDIAC) 20 MG/ML IV SOLN
INTRAVENOUS | Status: AC
  Filled 2015-06-04: qty 5

## 2015-06-04 MED ORDER — METHOCARBAMOL 500 MG PO TABS
ORAL_TABLET | ORAL | Status: AC
  Filled 2015-06-04: qty 1

## 2015-06-04 MED ORDER — PROPOFOL 10 MG/ML IV BOLUS
INTRAVENOUS | Status: DC | PRN
  Administered 2015-06-04: 200 mg via INTRAVENOUS

## 2015-06-04 MED ORDER — BUPIVACAINE-EPINEPHRINE (PF) 0.25% -1:200000 IJ SOLN
INTRAMUSCULAR | Status: AC
  Filled 2015-06-04: qty 30

## 2015-06-04 MED ORDER — GLYCOPYRROLATE 0.2 MG/ML IJ SOLN
INTRAMUSCULAR | Status: AC
  Filled 2015-06-04: qty 1

## 2015-06-04 MED ORDER — PROPOFOL 10 MG/ML IV BOLUS
INTRAVENOUS | Status: AC
  Filled 2015-06-04: qty 20

## 2015-06-04 MED ORDER — ACETAMINOPHEN 10 MG/ML IV SOLN
1000.0000 mg | Freq: Four times a day (QID) | INTRAVENOUS | Status: DC
  Filled 2015-06-04 (×2): qty 100

## 2015-06-04 MED ORDER — DEXAMETHASONE SODIUM PHOSPHATE 10 MG/ML IJ SOLN
INTRAMUSCULAR | Status: DC | PRN
  Administered 2015-06-04: 10 mg via INTRAVENOUS

## 2015-06-04 MED ORDER — MIDAZOLAM HCL 5 MG/5ML IJ SOLN
INTRAMUSCULAR | Status: DC | PRN
  Administered 2015-06-04: 2 mg via INTRAVENOUS

## 2015-06-04 MED ORDER — FLEET ENEMA 7-19 GM/118ML RE ENEM
1.0000 | ENEMA | Freq: Once | RECTAL | Status: DC
  Filled 2015-06-04: qty 1

## 2015-06-04 MED ORDER — EPHEDRINE SULFATE 50 MG/ML IJ SOLN
INTRAMUSCULAR | Status: AC
  Filled 2015-06-04: qty 1

## 2015-06-04 MED ORDER — SODIUM CHLORIDE 0.9 % IJ SOLN
INTRAMUSCULAR | Status: AC
  Filled 2015-06-04: qty 10

## 2015-06-04 MED ORDER — ONDANSETRON HCL 4 MG/2ML IJ SOLN
INTRAMUSCULAR | Status: AC
  Filled 2015-06-04: qty 2

## 2015-06-04 MED ORDER — BUPIVACAINE-EPINEPHRINE 0.25% -1:200000 IJ SOLN
INTRAMUSCULAR | Status: DC | PRN
  Administered 2015-06-04: 10 mL

## 2015-06-04 MED ORDER — PHENYLEPHRINE 40 MCG/ML (10ML) SYRINGE FOR IV PUSH (FOR BLOOD PRESSURE SUPPORT)
PREFILLED_SYRINGE | INTRAVENOUS | Status: AC
  Filled 2015-06-04: qty 10

## 2015-06-04 MED ORDER — ROCURONIUM BROMIDE 50 MG/5ML IV SOLN
INTRAVENOUS | Status: AC
  Filled 2015-06-04: qty 1

## 2015-06-04 MED ORDER — SODIUM CHLORIDE 0.9 % IJ SOLN
3.0000 mL | Freq: Two times a day (BID) | INTRAMUSCULAR | Status: DC
  Administered 2015-06-04 (×2): 3 mL via INTRAVENOUS

## 2015-06-04 MED ORDER — HYDROMORPHONE HCL 1 MG/ML IJ SOLN
INTRAMUSCULAR | Status: AC
  Filled 2015-06-04: qty 1

## 2015-06-04 MED ORDER — MIDAZOLAM HCL 2 MG/2ML IJ SOLN
INTRAMUSCULAR | Status: AC
  Filled 2015-06-04: qty 2

## 2015-06-04 MED ORDER — PHENOL 1.4 % MT LIQD: 1.0000 | OROMUCOSAL | Status: DC | PRN

## 2015-06-04 SURGICAL SUPPLY — 72 items
BLADE SURG ROTATE 9660 (MISCELLANEOUS) IMPLANT
BUR EGG ELITE 4.0 (BURR) IMPLANT
BUR EGG ELITE 4.0MM (BURR)
CLIP NEUROVISION LG (CLIP) ×3 IMPLANT
CLOSURE STERI-STRIP 1/2X4 (GAUZE/BANDAGES/DRESSINGS) ×2
CLSR STERI-STRIP ANTIMIC 1/2X4 (GAUZE/BANDAGES/DRESSINGS) ×4 IMPLANT
COVER SURGICAL LIGHT HANDLE (MISCELLANEOUS) ×3 IMPLANT
DRAPE C-ARM 42X72 X-RAY (DRAPES) ×3 IMPLANT
DRAPE C-ARMOR (DRAPES) ×3 IMPLANT
DRAPE POUCH INSTRU U-SHP 10X18 (DRAPES) ×3 IMPLANT
DRAPE SURG 17X23 STRL (DRAPES) ×3 IMPLANT
DRAPE U-SHAPE 47X51 STRL (DRAPES) ×3 IMPLANT
DRSG MEPILEX BORDER 4X8 (GAUZE/BANDAGES/DRESSINGS) ×6 IMPLANT
DURAPREP 26ML APPLICATOR (WOUND CARE) ×3 IMPLANT
ELECT BLADE 4.0 EZ CLEAN MEGAD (MISCELLANEOUS) ×3
ELECT BLADE 6.5 EXT (BLADE) IMPLANT
ELECT PENCIL ROCKER SW 15FT (MISCELLANEOUS) ×3 IMPLANT
ELECT REM PT RETURN 9FT ADLT (ELECTROSURGICAL) ×3
ELECTRODE BLDE 4.0 EZ CLN MEGD (MISCELLANEOUS) ×1 IMPLANT
ELECTRODE REM PT RTRN 9FT ADLT (ELECTROSURGICAL) ×1 IMPLANT
GLOVE BIOGEL PI IND STRL 8.5 (GLOVE) ×1 IMPLANT
GLOVE BIOGEL PI INDICATOR 8.5 (GLOVE) ×2
GLOVE SS BIOGEL STRL SZ 8.5 (GLOVE) ×1 IMPLANT
GLOVE SUPERSENSE BIOGEL SZ 8.5 (GLOVE) ×2
GOWN STRL REUS W/ TWL LRG LVL3 (GOWN DISPOSABLE) ×1 IMPLANT
GOWN STRL REUS W/TWL 2XL LVL3 (GOWN DISPOSABLE) ×3 IMPLANT
GOWN STRL REUS W/TWL LRG LVL3 (GOWN DISPOSABLE) ×2
GUIDEWIRE NITINOL BEVEL TIP (WIRE) ×12 IMPLANT
KIT BASIN OR (CUSTOM PROCEDURE TRAY) ×3 IMPLANT
KIT NEEDLE NVM5 EMG ELECT (KITS) ×1 IMPLANT
KIT NEEDLE NVM5 EMG ELECTRODE (KITS) ×2
KIT POSITION SURG JACKSON T1 (MISCELLANEOUS) IMPLANT
KIT ROOM TURNOVER OR (KITS) ×3 IMPLANT
LIGHT SOURCE ANGLE TIP STR 7FT (MISCELLANEOUS) ×3 IMPLANT
NEEDLE 22X1 1/2 (OR ONLY) (NEEDLE) ×3 IMPLANT
NEEDLE I-PASS III (NEEDLE) ×3 IMPLANT
NEEDLE SPNL 18GX3.5 QUINCKE PK (NEEDLE) ×6 IMPLANT
NS IRRIG 1000ML POUR BTL (IV SOLUTION) ×3 IMPLANT
PACK LAMINECTOMY ORTHO (CUSTOM PROCEDURE TRAY) ×3 IMPLANT
PACK UNIVERSAL I (CUSTOM PROCEDURE TRAY) ×3 IMPLANT
PAD ARMBOARD 7.5X6 YLW CONV (MISCELLANEOUS) ×9 IMPLANT
PATTIES SURGICAL .5 X.5 (GAUZE/BANDAGES/DRESSINGS) IMPLANT
PATTIES SURGICAL .5 X1 (DISPOSABLE) ×3 IMPLANT
POSITIONER HEAD PRONE TRACH (MISCELLANEOUS) ×3 IMPLANT
PROBE BALL TIP NVM5 SNG USE (BALLOONS) ×3 IMPLANT
PUTTY DBX 1CC (Putty) ×3 IMPLANT
PUTTY DBX 1CC DEPUY (Putty) ×1 IMPLANT
ROD RELINE MAS LORD 5.5X50 (Rod) ×6 IMPLANT
SCREW LOCK RELINE 5.5 TULIP (Screw) ×12 IMPLANT
SCREW RELINE RED 6.5X50MM POLY (Screw) ×6 IMPLANT
SHANK RELINE MOD 6.5X50MM TX (Neuro Prosthesis/Implant) ×6 IMPLANT
SHEET CONFORM 45LX20WX5H (Bone Implant) ×3 IMPLANT
SPINE TULIP RELINE MOD (Neuro Prosthesis/Implant) ×6 IMPLANT
SPONGE LAP 4X18 X RAY DECT (DISPOSABLE) ×6 IMPLANT
SPONGE SURGIFOAM ABS GEL 100 (HEMOSTASIS) ×3 IMPLANT
SURGIFLO W/THROMBIN 8M KIT (HEMOSTASIS) ×3 IMPLANT
SUT BONE WAX W31G (SUTURE) ×3 IMPLANT
SUT MNCRL AB 3-0 PS2 18 (SUTURE) ×6 IMPLANT
SUT MON AB 3-0 SH 27 (SUTURE) ×2
SUT MON AB 3-0 SH27 (SUTURE) ×1 IMPLANT
SUT VIC AB 1 CT1 18XCR BRD 8 (SUTURE) ×2 IMPLANT
SUT VIC AB 1 CT1 8-18 (SUTURE) ×4
SUT VIC AB 2-0 CT1 18 (SUTURE) ×6 IMPLANT
SYR BULB IRRIGATION 50ML (SYRINGE) ×3 IMPLANT
SYR CONTROL 10ML LL (SYRINGE) ×3 IMPLANT
TLIF XLRG 11MM (Neuro Prosthesis/Implant) ×3 IMPLANT
TOWEL OR 17X24 6PK STRL BLUE (TOWEL DISPOSABLE) ×6 IMPLANT
TOWEL OR 17X26 10 PK STRL BLUE (TOWEL DISPOSABLE) ×3 IMPLANT
TRAY FOLEY CATH 16FRSI W/METER (SET/KITS/TRAYS/PACK) ×3 IMPLANT
TULIP SPINE RELINE MOD (Neuro Prosthesis/Implant) ×2 IMPLANT
WATER STERILE IRR 1000ML POUR (IV SOLUTION) IMPLANT
YANKAUER SUCT BULB TIP NO VENT (SUCTIONS) ×3 IMPLANT

## 2015-06-04 NOTE — H&P (Addendum)
History of Present Illness (Robin C. Young; 05/30/2015 8:53 AM) The patient is a 59 year old male who comes in today for a preoperative History and Physical. The patient is scheduled for a TLIF L4-5 to be performed by Dr. Debria Garret D. Shon Baton, MD at [facility] on 06-04-15 . Please see the hospital record for complete dictated history and physical. The patient reports low back symptoms including pain, low back pain, numbness, pain with flexion, tingling and bil posterior thigh pain which began 10 year(s) ago without any known injury. Symptoms are reported to be located symmetrically and on the right side more than the left and Symptoms include pain, numbness and tingling. The pain radiates to the left buttock, left posterior thigh, right buttock and right posterior thigh. The patient describes the pain as burning, aching and tingling. The symptom onset was gradual. The patient describes the severity of their symptoms as severe. The patient feels as if the symptoms are worsening. Symptoms are exacerbated by standing. Symptoms are relieved by opioid analgesics. The patient is not currently being treated for this problem. The patient states that the first episode of back pain that they can recall was year(s) ago. The patient reports that they have episodes of thier symptoms times a month. Pertinent medical history includes chronic back pain. Prior to being seen today the patient was previously evaluated Dr. Jeral Fruit and Dr. Lucia Gaskins. Symptoms present at the patient's previous evaluation included back pain and numbness. Past evaluation has included x-ray of the lumbar spine and MRI of the lumbar spine. Past treatment has included opioid analgesics. The patient does not have an attorney representing them regarding this matter. Note for "Back pain": He states he works 2 jobs. One job he does repetitive lifting of about 25 lbs. His other job he lifts no more than 10 lbs repetitively. He states he was scheduled for surgery  with Dr. Jeral Fruit in Oct, of 2015. The day before surgery he was called and told that insurance would not approve. He saw his medical doctor for awhile, but eventually was referred to neurologist. He is attending physical therapy per insurances request.  Allergies  No Known Drug Allergies03/16/2016  Family History  Rheumatoid Arthritis Maternal Grandmother. Hypertension Mother. Cancer Father.  Social History Not under pain contract No history of drug/alcohol rehab Number of flights of stairs before winded 4-5 Tobacco use Current some day smoker. 01/29/2015: smoke(d) 1 pack(s) per day Tobacco / smoke exposure 01/29/2015: no Marital status married Current drinker 01/29/2015: Currently drinks beer only occasionally per week Children 2 Current work status working full time Living situation live with spouse Exercise Exercises rarely  Medication History  Gabapentin (  Capsule, Oral) Active. Cialis (  Tablet, Oral) Active. Lisinopril (  Tablet, Oral) Active. Medications Reconciled  Past Surgical History  Arthroscopy of Knee bilateral  Other Problems Spinal stenosis of lumbar region (M48.06) High blood pressure  Vitals  05/30/2015 8:54 AM Weight: 213 lb Height: 73in Body Surface Area: 2.21 m Body Mass Index: 28.1 kg/m  Temp.: 98.67F(Oral)  BP: 171/83 (Sitting, Right Arm, Standard)  Physical Exam  General General Appearance-Not in acute distress. Orientation-Oriented X3. Build & Nutrition-Well nourished and Well developed.  Integumentary General Characteristics Surgical Scars - no surgical scar evidence of previous lumbar surgery. Lumbar Spine-Skin examination of the lumbar spine is without deformity, skin lesions, lacerations or abrasions.  Chest and Lung Exam Auscultation Breath sounds - Normal and Clear.  Cardiovascular Auscultation Rhythm - Regular rate and rhythm.  Abdomen Palpation/Percussion Palpation and  Percussion of the abdomen reveal - Soft, Non Tender and No Rebound tenderness.  Peripheral Vascular Lower Extremity Palpation - Posterior tibial pulse - Bilateral - 2+. Dorsalis pedis pulse - Bilateral - 2+.  Neurologic Sensation Lower Extremity - Bilateral - sensation is intact in the lower extremity. Reflexes Patellar Reflex - Bilateral - 2+. Achilles Reflex - Bilateral - 2+. Clonus - Bilateral - clonus not present. Hoffman's Sign - Bilateral - Hoffman's sign not present. Testing Seated Straight Leg Raise - Left - Seated straight leg raise negative. Right - Seated straight leg raise positive.  Musculoskeletal Spine/Ribs/Pelvis  Lumbosacral Spine: Inspection and Palpation - Tenderness - left lumbar paraspinals tender to palpation and right lumbar paraspinals tender to palpation. Strength and Tone: Strength - Hip Flexion - Bilateral - 5/5. Knee Extension - Bilateral - 5/5. Knee Flexion - Bilateral - 5/5. Ankle Dorsiflexion - Left - 5/5. Right - 4-/5. Ankle Plantarflexion - Left - 5/5. Right - 4-/5. Heel walk - Bilateral - unable to heel walk. Toe Walk - Bilateral - able to walk on toes without difficulty. Heel-Toe Walk - Bilateral - able to heel-toe walk without difficulty. ROM - Flexion - mildly decreased range of motion and painful. Extension - mildly decreased range of motion and painful. Left Lateral Bending - mildly decreased range of motion and painful. Right Lateral Bending - full range of motion. Pain - extension is more painful than flexion. Lumbosacral Spine - Waddell's Signs - no Waddell's signs present. Lower Extremity Range of Motion - No true hip, knee or ankle pain with range of motion. Gait and Station - [employer] - no assistive devices.  MRI from 01-02-15 clearly shows a degenerative spondylolisthesis at L4-5, Grade 2 with significant central and lateral recess stenosis with a hard disk osteophyte causing right lateral recess and foraminal stenosis and an acute disk  pathology causing left sided acute disk protrusion causing left sided neural compression.  His X-rays today in my office demonstrates a Grade 2 spondylolisthesis at L4-5 with increased slippage from flexion to extension  At this point in time, given failure of an appropriate conservative management, the long duration of his symptoms (greater than two years) he would like to proceed with surgery which I think is a reasonable procedure. The procedure of choice would be a TLIF  at L4-5 to alleviate the right radicular leg pain and hopefully improve the motor and sensory deficits and then stabilization of the level given the underlying spondylolisthesis.  I did review the risk with him to include infection, bleeding, nerve damage, death , stroke, paralysis, failure to heal, need for further surgery, ongoing or worse pain, loss of bowel and/or bladder control, leak of spinal fluid, no improvement in the neurological pain or deficits. All of his questions were addressed. We will plan on proceeding with the surgery in the near future.  Posterior decompression/Fusion:Risks of surgery include infection, bleeding, nerve damage, death, stroke, paralysis, failure to heal, need for further surgery, ongoing or worse pain, need for further surgery, CSF leak, loss of bowel or bladder, and recurrent disc herniation or stenosis which would necessitate need for further surgery. Non-union, hardware failure, adjacent segment disease and recurrent pain. Hardware breakage, mal-position requiring surgery to correct or remove. Goal Of Surgery:Discussed that goal of surgery is to reduce pain and improve function and quality of life. Patient is aware that despite all appropriate treatment that there pain and function could be the same, worse, or different.

## 2015-06-04 NOTE — Brief Op Note (Signed)
06/04/2015  12:33 PM  PATIENT:  [name] CoolerAlfonzo Fulwider Jr.  59 y.o. male  PRE-OPERATIVE DIAGNOSIS:  L4-5 GRADE 2 SLIP WITH STENOSIS  POST-OPERATIVE DIAGNOSIS:  L4-5 GRADE 2 SLIP WITH STENOSIS  PROCEDURE:  Procedure(s): TRANSFORAMINAL LUMBAR INTERBODY FUSION (TLIF) WITH PEDICLE SCREW FIXATION 1 LEVEL TLIP L4-5 (N/A)  SURGEON:  Surgeon(s) and Role:    * Venita Lickahari Whitni Pasquini, MD - Primary  PHYSICIAN ASSISTANT:   ASSISTANTS: none   ANESTHESIA:   general  EBL:  Total I/O In: 2650 [I.V.:2400; IV Piggyback:250] Out: 625 [Urine:425; Blood:200]  BLOOD ADMINISTERED:none  DRAINS: none   LOCAL MEDICATIONS USED:  MARCAINE     SPECIMEN:  No Specimen  DISPOSITION OF SPECIMEN:  N/A  COUNTS:  YES  TOURNIQUET:  * No tourniquets in log *  DICTATION: .Other Dictation: Dictation Number [id number]  PLAN OF CARE: Admit to inpatient   PATIENT DISPOSITION:  PACU - hemodynamically stable.

## 2015-06-04 NOTE — Anesthesia Postprocedure Evaluation (Signed)
  Anesthesia Post-op Note  Patient: [name] Maudry MayhewLeath Jr.  Procedure(s) Performed: Procedure(s): TRANSFORAMINAL LUMBAR INTERBODY FUSION (TLIF) WITH PEDICLE SCREW FIXATION 1 LEVEL TLIP L4-5 (N/A)  Patient Location: PACU  Anesthesia Type:General  Level of Consciousness: awake, alert  and oriented  Airway and Oxygen Therapy: Patient Spontanous Breathing  Post-op Pain: mild  Post-op Assessment: Post-op Vital signs reviewed, Patient's Cardiovascular Status Stable, Respiratory Function Stable, Patent Airway and No signs of Nausea or vomiting LLE Motor Response: Abnormal extension (Decerebrate), Purposeful movement LLE Sensation: Full sensation RLE Motor Response: Purposeful movement RLE Sensation: Full sensation      Post-op Vital Signs: Reviewed and stable  Last Vitals:  Filed Vitals:   06/04/15 1233  BP: 165/93  Pulse: 77  Temp: 36.7 C  Resp: 12    Complications: No apparent anesthesia complications

## 2015-06-04 NOTE — Plan of Care (Signed)
Problem: Consults Goal: Diagnosis - Spinal Surgery Outcome: Completed/Met Date Met:  06/04/15 Thoraco/Lumbar Spine Fusion

## 2015-06-04 NOTE — Op Note (Signed)
NAMEstevan [name]:  [name], [name]               ACCOUNT NO.:  [id number]  MEDICAL RECORD NO.:  [id number]  LOCATION:  [facility]                         FACILITY:  [facility]  PHYSICIAN:  [name] [name], M.D. DATE OF BIRTH:  August 22, 1956  DATE OF PROCEDURE:  06/04/2015 DATE OF DISCHARGE:                              OPERATIVE REPORT   PREOPERATIVE DIAGNOSIS:  Isthmic spondylolisthesis, L4-5 with spinal stenosis and neural compression.  POSTOPERATIVE DIAGNOSIS:  Isthmic spondylolisthesis, L4-5 with spinal stenosis and neural compression.  OPERATIVE PROCEDURE:  Transforaminal lumbar interbody fusion, L4-5.  COMPLICATIONS:  None.  CONDITION:  Stable.  COMPLICATIONS:  No intraoperative complications.  Instrumentation system used was the NuVasive pedicle screw MIS system with 15 mm length, 6.5 diameter screws at L4-L5 with a 15 mm rod locking them in place with a Titan titanium intervertebral cage, the extra long length on 11 mm diameter cage packed with local bone graft plus DBX.  HISTORY:  This is a very pleasant gentleman, who has had chronic debilitating back, buttock, and radicular right leg pain.  Attempts at conservative management have failed to alleviate his symptoms, so he elected to proceed with surgery.  All appropriate risks, benefits, and alternatives were discussed with the patient and consent was obtained.  OPERATIVE NOTE:  The patient was brought to the operating room, placed supine on the operating table.  After successful induction of general anesthesia, endotracheal intubation, TEDs, SCDs, and a Foley were inserted.  The patient was turned prone onto the Wilson frame. Neuromonitoring representative then placed all appropriate intraoperative needles for SSEP and EMG monitoring.  The back was then prepped and draped in a standard fashion.  Time-out was taken to confirm patient, procedure, and all other pertinent important data.  After the time-out, I began the procedure.  Stab  incision was made about 2 fingerbreadths lateral to midline on the left side, centered over the medial aspect of the L4 and L5 pedicle.  I then advanced the Jamshidi needle down percutaneously to the lateral border of pedicle.  I connected to the neuromonitoring device and using x-ray and neuromonitoring, I advanced the Jamshidi needle until I was near the medial border of the pedicle.  In the lateral plane, I was just beyond the posterior margin of the vertebral body.  This indicated that I was in the proper trajectory and again there was no neurodiagnostic evidence of breach.  I advanced my Jamshidi needle into the vertebral body.  I aspirated 10 mL of blood to mix with the Conform sheath.  I then placed the guide pin down the pedicles.  I repeated this entire procedure at the L5 level.  I then tapped over both guide pins and placed 50 mm length screws.  Each individual screw was then stimulated, again confirmed there was no breach in the pedicle.  X-rays were satisfactory. I then went to the contralateral side.  A Wiltse incision was made about 2.5 inches in length centered over the lateral aspect of the pedicle 4 and 5.  Sharp dissection was carried out down to the deep fascia.  I incised the deep fascia and then bluntly stripped the paraspinal muscle until I could palpate the facet.  I  then placed the Jamshidi needle and using the same technique, I had used on the contralateral side, cannulated the 4 and 5 pedicle and then tapped and then placed the pedicle screw down into the L4-L5 spaces.  These were connected to the retractor blades.  I then set up, my retractor.  I mobilized the medial portion of the paraspinal muscle.  I placed my medial retractor.  At this point, I had excellent visualization of the posterolateral aspect of the spine.  At this point, I used a bipolar electrocautery to remove the facet capsule and noted significant degenerative changes of the facet itself.  I  used an osteotome to remove the entire inferior L4 facet.  I then developed a plane underneath the lamina of L4 and performed a generous laminotomy of L4 using a 3 mm Kerrison rongeur.  I then dissected through the ligamentum flavum, so I can visualize the thecal sac and developed this plane and used my 3 mm Kerrison rongeur to remove the ligamentum flavum and exposed thecal sac.  I then removed the medial osteophytes from the L5 superior facet and identified the L5 nerve root.  A foraminotomy was performed in order to decompress this. I then went superiorly and resected the remaining portion of the L4 pars and unroofed the L4 nerve root.  This was well visualized and adequately decompressed.  I then retracted the thecal sac medially and then incised the annulus with 15 blade scalpel.  Using a combination of pituitary rongeurs, curettes, and Kerrison rongeurs, I removed all of the disk material at the L4-5 level.  I scraped the endplates, so I had bleeding subchondral bone.  I then trialed the endplate was sequential sizes and elected to use a size 11 extra long Titan cage.  I irrigated the wound copiously with normal saline and then placed the Conform sheath along the anterior aspect of the annulus and packed some bone graft as well. I then obtained the implant, which was packed with local bone plus DBX. I then advanced it using fluoroscopic guidance.  He was then knocked into a horizontal position.  It was well fitting and was secured.  I removed the kyphosis that I had built into the [place] frame.  The reduction was significant.  There was improvement in his spondylolisthesis.  At this point, the wounds were copiously irrigated with normal saline.  I then applied the polyaxial head to the 2 screws, removed the retracting blades and advanced the screws to the appropriate depths.  I measured, placed a 50 mm rod and then locked the rod in place with top nuts.  They were torqued off  according to manufacturer's standards.  I irrigated this wound copiously with normal saline and then made sure I had hemostasis using bipolar electrocautery.  I then placed a thrombin-soaked Gelfoam patty over the exposed thecal sac.  I then closed the deep fascia with interrupted #1 Vicryl sutures, superficial with 2-0 Vicryl sutures, and 3-0 Monocryl for the skin.  I then went back to the left-hand side and measured for the rod, placed the rod percutaneously and then made sure that it was properly seated between in both screws.  I then advanced the locking nuts down and torqued them off again according to manufacturer's standards.  At this point, I then took final x-rays which were satisfactory.  Hardware and grafts were in good position.  I irrigated out these 2 wounds and closed them in a layered fashion with 2-0 Vicryl sutures and a  3-0 Monocryl.  Steri-Strips and dry dressings were applied.  The patient was extubated, transferred to the PACU.  At the end of the case, all needle and sponge counts were correct.  There were no adverse intraoperative events.     [name] [name], M.D.     DDB/MEDQ  D:  06/04/2015  T:  06/04/2015  Job:  [id number]

## 2015-06-04 NOTE — Anesthesia Procedure Notes (Addendum)
Procedure Name: Intubation Date/Time: 06/04/2015 8:32 AM Performed by: ROCK, JENNIFER K Pre-anesthesia Checklist: Patient identified, Emergency Drugs available, Suction available, Patient being monitored and Timeout performed Patient Re-evaluated:Patient Re-evaluated prior to inductionOxygen Delivery Method: Circle system utilized Preoxygenation: Pre-oxygenation with 100% oxygen Intubation Type: IV induction Ventilation: Mask ventilation without difficulty Laryngoscope Size: Miller and 3 Grade View: Grade I Tube type: Oral Tube size: 7.5 mm Number of attempts: 1 Airway Equipment and Method: Stylet and LTA kit utilized Placement Confirmation: ETT inserted through vocal cords under direct vision,  positive ETCO2,  CO2 detector and breath sounds checked- equal and bilateral Secured at: 21 cm Tube secured with: Tape Dental Injury: Teeth and Oropharynx as per pre-operative assessment  Comments: Soft bite block utilized.     

## 2015-06-04 NOTE — Evaluation (Signed)
Occupational Therapy Evaluation Patient Details Name: [name] [name]. MRN: [id number] DOB: November 11, 1956 Today's Date: 06/04/2015    History of Present Illness 59 y.o. s/p TRANSFORAMINAL LUMBAR INTERBODY FUSION (TLIF) WITH PEDICLE SCREW FIXATION 1 LEVEL TLIP L4-5    Clinical Impression   Pt independent with ADLs, PTA. Feel pt will benefit from acute OT to increase independence prior to d/c. Do not feel pt will need followup OT at d/c.    Follow Up Recommendations  No OT follow up;Supervision - Intermittent    Equipment Recommendations  Other (comment) (AE)    Recommendations for Other Services       Precautions / Restrictions Precautions Precautions: Back;Fall Precaution Booklet Issued: Yes (comment) Precaution Comments: educated on back precautions Required Braces or Orthoses: Spinal Brace Spinal Brace: Lumbar corset;Applied in sitting position (adjusted in standing) Restrictions Weight Bearing Restrictions: No      Mobility Bed Mobility Overal bed mobility: Needs Assistance Bed Mobility: Rolling;Sit to Sidelying Rolling: Supervision       Sit to sidelying: Supervision General bed mobility comments: cues for technique.  Transfers Overall transfer level: Needs assistance   Transfers: Sit to/from Stand Sit to Stand: Supervision              Balance  Pt with LOB during ambulation-Min guard. No LOB sitting EOB (cues for upright posture)                                          ADL Overall ADL's : Needs assistance/impaired Eating/Feeding: Independent; Bed level (see comment below) Eating/Feeding Details (indicate cue type and reason): prior to OT walking in room for session, pt standing without brace, drinking water             Upper Body Dressing : Minimal assistance;Sitting;Standing   Lower Body Dressing: Maximal assistance;Sit to/from stand   Toilet Transfer: Min guard;Ambulation;Supervision/safety (bed; supervision for sit to  stand)           Functional mobility during ADLs: Min guard General ADL Comments: Minimal assist with back brace. Educated on AE as pt unable to cross legs over knees for LB ADLs. Discussed incorporating precautions into functional activities. Discussed options for shower chair and talked about 3 in 1 uses. Recommended sitting for LB bathing.  Educated on what pt could use for toilet aide if needed. Educated on back brace.      Vision     Perception     Praxis      Pertinent Vitals/Pain Pain Assessment: 0-10 Pain Score: 8  Pain Location: back Pain Descriptors / Indicators: Aching Pain Intervention(s): Monitored during session;Repositioned     Hand Dominance     Extremity/Trunk Assessment Upper Extremity Assessment Upper Extremity Assessment: Overall WFL for tasks assessed   Lower Extremity Assessment Lower Extremity Assessment: Defer to PT evaluation       Communication Communication Communication:  (noticed some stuttering)   Cognition Arousal/Alertness: Awake/alert (became lethargic) Behavior During Therapy: WFL for tasks assessed/performed Overall Cognitive Status: Within Functional Limits for tasks assessed                     General Comments       Exercises       Shoulder Instructions      Home Living Family/patient expects to be discharged to:: Private residence Living Arrangements: Non-relatives/Friends Available Help at Discharge: Friend(s);Available 24 hours/day Type of  Home: Apartment Home Access: Level entry     Home Layout: One level     Bathroom Shower/Tub: Producer, television/film/videoWalk-in shower   Bathroom Toilet: Handicapped height     Home Equipment: Grab bars - toilet;Grab bars - tub/shower;Cane - single point (outdoor chair he can use in shower)          Prior Functioning/Environment Level of Independence: Independent             OT Diagnosis: Acute pain   OT Problem List: Decreased range of motion;Decreased knowledge of use of DME  or AE;Decreased knowledge of precautions;Pain;Impaired balance (sitting and/or standing)   OT Treatment/Interventions: Self-care/ADL training;DME and/or AE instruction;Therapeutic activities;Patient/family education;Balance training    OT Goals(Current goals can be found in the care plan section) Acute Rehab OT Goals Patient Stated Goal: not stated OT Goal Formulation: With patient Time For Goal Achievement: 06/11/15 Potential to Achieve Goals: Good ADL Goals Pt Will Perform Grooming: standing;with modified independence Pt Will Perform Lower Body Dressing: with modified independence;sit to/from stand;with adaptive equipment Pt Will Transfer to Toilet: with modified independence;ambulating;grab bars (elevated toilet) Pt Will Perform Toileting - Clothing Manipulation and hygiene: with modified independence;sit to/from stand Pt Will Perform Tub/Shower Transfer: Shower transfer;with modified independence;ambulating;shower seat Additional ADL Goal #1: Pt will independently verbalize 3/3 back precautions and maintain during session. Additional ADL Goal #2: Pt will don/doff back brace with setup assist.   OT Frequency: Min 2X/week   Barriers to D/C:            Co-evaluation              End of Session Equipment Utilized During Treatment: Back brace;Gait belt Nurse Communication: Other (comment) (no OT followup or OT DME to order)  Activity Tolerance: Patient tolerated treatment well Patient left: with call bell/phone within reach;in bed;with family/visitor present   Time: [id number] OT Time Calculation (min): 17 min Charges:  OT General Charges $OT Visit: 1 Procedure OT Evaluation $Initial OT Evaluation Tier I: 1 Procedure G-CodesEarlie Raveling:    Amirr Achord L OTR/L [phone] 06/04/2015, 4:53 PM

## 2015-06-04 NOTE — Care Management Utilization Note (Signed)
Utilization review completed. Lambert Jeanty, RN Case Manager [phone]. 

## 2015-06-04 NOTE — Progress Notes (Signed)
SCHEDULED IV ACETAMINOPHEN:  CONVERSION TO ORAL ROUTE to complete the ordered doses.  The Pharmacy and Therapeutics Committee has restricted administration of IV acetaminophen (with a 24 hr maximum duration) to patients who meet both of the following criteria:  Unable to tolerate oral or enteral medication  Contraindication to NSAIDs  Because the patient has taken other oral medications today (oxy IR and Robaxin), IV acetaminophen has been converted to PO to complete the course of therapy originally ordered.  If PO acetaminophen should be continued beyond the original stop time, please adjust the order accordingly using the "modify" function.   If you have questions about this conversion, please contact the pharmacy department.  Brigid ReBajbus, Alexxus Sobh, [facility] LancasterRPH 06/04/2015 2:17 PM

## 2015-06-04 NOTE — Transfer of Care (Signed)
Immediate Anesthesia Transfer of Care Note  Patient: [name] Maudry MayhewLeath Jr.  Procedure(s) Performed: Procedure(s): TRANSFORAMINAL LUMBAR INTERBODY FUSION (TLIF) WITH PEDICLE SCREW FIXATION 1 LEVEL TLIP L4-5 (N/A)  Patient Location: PACU  Anesthesia Type:General  Level of Consciousness: awake, oriented and patient cooperative  Airway & Oxygen Therapy: Patient Spontanous Breathing and Patient connected to face mask oxygen  Post-op Assessment: Report given to RN and Post -op Vital signs reviewed and stable  Post vital signs: Reviewed  Last Vitals:  Filed Vitals:   06/04/15 0649  BP: 155/88  Pulse: 57  Temp: 36.4 C  Resp: 18    Complications: No apparent anesthesia complications

## 2015-06-05 ENCOUNTER — Inpatient Hospital Stay (HOSPITAL_COMMUNITY): Payer: PRIVATE HEALTH INSURANCE

## 2015-06-05 MED ORDER — OXYCODONE-ACETAMINOPHEN 10-325 MG PO TABS: 1.0000 | ORAL_TABLET | ORAL | Status: DC | PRN

## 2015-06-05 MED ORDER — METHOCARBAMOL 500 MG PO TABS: 500.0000 mg | ORAL_TABLET | Freq: Three times a day (TID) | ORAL | Status: DC | PRN

## 2015-06-05 MED ORDER — ONDANSETRON HCL 4 MG PO TABS: 4.0000 mg | ORAL_TABLET | Freq: Three times a day (TID) | ORAL | Status: DC | PRN

## 2015-06-05 NOTE — Evaluation (Signed)
Physical Therapy Evaluation Patient Details Name: [name] [name]. MRN: [id number] DOB: 12-16-1955 Today's Date: 06/05/2015   History of Present Illness  59 y.o. s/p TRANSFORAMINAL LUMBAR INTERBODY FUSION (TLIF) WITH PEDICLE SCREW FIXATION 1 LEVEL TLIP L4-5   Clinical Impression  Pt admitted with above diagnosis. Pt currently with functional limitations due to the deficits listed below (see PT Problem List). At the time of PT eval pt required close supervision at times during gait training as pt demonstrates balance deficits. He reports that he has been walking with a trunk lean to the right and a limp for about a year. Feel outpatient PT will be beneficial when MD feels is appropriate, to improve balance, posture, and any muscle imbalances. Pt will benefit from acute skilled PT to increase their independence and safety with mobility to allow discharge to the venue listed below.       Follow Up Recommendations Outpatient PT (When appropriate per post-op protocol)    Equipment Recommendations  None recommended by PT    Recommendations for Other Services       Precautions / Restrictions Precautions Precautions: Back;Fall Precaution Booklet Issued: Yes (comment) Precaution Comments: Pt was able to recall 2/3 back precautions and was educated on 3/3.  Required Braces or Orthoses: Spinal Brace Spinal Brace: Lumbar corset;Applied in sitting position (adjusted in standing) Restrictions Weight Bearing Restrictions: No      Mobility  Bed Mobility Overal bed mobility: Needs Assistance Bed Mobility: Rolling;Sidelying to Sit Rolling: Modified independent (Device/Increase time) Sidelying to sit: Modified independent (Device/Increase time)       General bed mobility comments: Good technique noted  Transfers Overall transfer level: Needs assistance Equipment used: None Transfers: Sit to/from Stand Sit to Stand: Modified independent (Device/Increase time)         General transfer  comment: No unsteadiness noted.  Ambulation/Gait Ambulation/Gait assistance: Supervision Ambulation Distance (Feet): 300 Feet Assistive device: None Gait Pattern/deviations: Step-through pattern;Decreased stride length;Staggering right Gait velocity: Decreased Gait velocity interpretation: Below normal speed for age/gender General Gait Details: Pt occasional with balance disturbances which required close supervision for safety. Pt reports overall improvement in ambulation since surgery, however not quite at a mod I level yet.  Stairs            Wheelchair Mobility    Modified Rankin (Stroke Patients Only)       Balance Overall balance assessment: Needs assistance Sitting-balance support: Feet supported;No upper extremity supported Sitting balance-Leahy Scale: Normal     Standing balance support: No upper extremity supported;During functional activity Standing balance-Leahy Scale: Fair                               Pertinent Vitals/Pain Pain Assessment: 0-10 Pain Score: 3  Pain Location: back Pain Descriptors / Indicators: Operative site guarding Pain Intervention(s): Limited activity within patient's tolerance;Monitored during session;Repositioned    Home Living Family/patient expects to be discharged to:: Private residence Living Arrangements: Non-relatives/Friends Available Help at Discharge: Friend(s);Available 24 hours/day (Girlfriend) Type of Home: Apartment Home Access: Level entry     Home Layout: One level Home Equipment: Grab bars - toilet;Grab bars - tub/shower;Cane - single point (outdoor chair he can use in shower)      Prior Function Level of Independence: Independent         Comments: Was walking with trunk lean and antalgic gait pattern for over a year     Hand Dominance  Extremity/Trunk Assessment   Upper Extremity Assessment: Overall WFL for tasks assessed           Lower Extremity Assessment: RLE  deficits/detail RLE Deficits / Details: Decreased strength and AROM consistent with radicular symptoms prior to surgery.    Cervical / Trunk Assessment: Normal  Communication   Communication:  (noticed some stuttering)  Cognition Arousal/Alertness: Awake/alert Behavior During Therapy: WFL for tasks assessed/performed Overall Cognitive Status: Within Functional Limits for tasks assessed                      General Comments      Exercises        Assessment/Plan    PT Assessment Patient needs continued PT services  PT Diagnosis Acute pain;Abnormality of gait   PT Problem List Decreased strength;Decreased range of motion;Decreased activity tolerance;Decreased balance;Decreased mobility;Decreased knowledge of use of DME;Decreased safety awareness;Decreased knowledge of precautions;Pain  PT Treatment Interventions DME instruction;Gait training;Stair training;Functional mobility training;Therapeutic activities;Therapeutic exercise;Neuromuscular re-education;Patient/family education   PT Goals (Current goals can be found in the Care Plan section) Acute Rehab PT Goals Patient Stated Goal: not stated PT Goal Formulation: With patient Time For Goal Achievement: 06/12/15 Potential to Achieve Goals: Good    Frequency Min 5X/week   Barriers to discharge        Co-evaluation               End of Session Equipment Utilized During Treatment: Back brace Activity Tolerance: Patient tolerated treatment well Patient left: in chair;with call bell/phone within reach Nurse Communication: Mobility status         Time: 5366-4403 PT Time Calculation (min) (ACUTE ONLY): 18 min   Charges:   PT Evaluation $Initial PT Evaluation Tier I: 1 Procedure     PT G CodesConni Slipper 06-06-2015, 10:54 AM   Conni Slipper, PT, DPT Acute Rehabilitation Services Pager: [phone]

## 2015-06-05 NOTE — Progress Notes (Signed)
    Subjective: Procedure(s) (LRB): TRANSFORAMINAL LUMBAR INTERBODY FUSION (TLIF) WITH PEDICLE SCREW FIXATION 1 LEVEL TLIP L4-5 (N/A) 1 Day Post-Op  Patient reports pain as 2 on 0-10 scale.  Reports none leg pain reports incisional back pain   Positive void Positive bowel movement Positive flatus Negative chest pain or shortness of breath  Objective: Vital signs in last 24 hours: Temp:  [97.8 F (36.6 C)-99.7 F (37.6 C)] 99.7 F (37.6 C) (07/21 1551) Pulse Rate:  [60-102] 102 (07/21 1551) Resp:  [16-20] 16 (07/21 1551) BP: (136-165)/(72-96) 145/89 mmHg (07/21 1551) SpO2:  [96 %-100 %] 100 % (07/21 1551)  Intake/Output from previous day: 07/20 0701 - 07/21 0700 In: 4010 [P.O.:960; I.V.:2800; IV Piggyback:250] Out: 1875 [Urine:1575; Blood:300]  Labs: No results for input(s): WBC, RBC, HCT, PLT in the last 72 hours. No results for input(s): NA, K, CL, CO2, BUN, CREATININE, GLUCOSE, CALCIUM in the last 72 hours. No results for input(s): LABPT, INR in the last 72 hours.  Physical Exam: Neurologically intact ABD soft Intact pulses distally Incision: dressing C/D/I Compartment soft  Assessment/Plan: Patient stable  xrays satisfactory hardware placement Continue mobilization with physical therapy Continue care  Advance diet Up with therapy Plan for discharge tomorrow    Venita Lick, MD [facility] [phone]

## 2015-06-05 NOTE — Progress Notes (Signed)
Occupational Therapy Treatment Patient Details Name: [name] [name]. MRN: [id number] DOB: 1956/03/24 Today's Date: 06/05/2015    History of present illness 59 y.o. s/p TRANSFORAMINAL LUMBAR INTERBODY FUSION (TLIF) WITH PEDICLE SCREW FIXATION 1 LEVEL TLIP L4-5    OT comments  Pt demonstrates good understanding of back precautions.  He requires supervision for LB ADLs, but will progress quickly to mod I.   He is eager to discharge home.   Follow Up Recommendations  No OT follow up;Supervision - Intermittent    Equipment Recommendations  3 in 1 bedside comode    Recommendations for Other Services      Precautions / Restrictions Precautions Precautions: Back;Fall Precaution Booklet Issued: Yes (comment) Precaution Comments: Pt was able to recall 2/3 back precautions and was educated on 3/3.  Required Braces or Orthoses: Spinal Brace Spinal Brace: Lumbar corset;Applied in sitting position (adjusted in standing) Restrictions Weight Bearing Restrictions: No       Mobility Bed Mobility Overal bed mobility: Needs Assistance Bed Mobility: Rolling;Sidelying to Sit Rolling: Modified independent (Device/Increase time) Sidelying to sit: Modified independent (Device/Increase time)       General bed mobility comments: Good technique noted  Transfers Overall transfer level: Modified independent Equipment used: None Transfers: Sit to/from Stand Sit to Stand: Modified independent (Device/Increase time)         General transfer comment: No unsteadiness noted.    Balance Overall balance assessment: No apparent balance deficits (not formally assessed) Sitting-balance support: Feet supported;No upper extremity supported Sitting balance-Leahy Scale: Normal     Standing balance support: No upper extremity supported;During functional activity Standing balance-Leahy Scale: Fair                     ADL Overall ADL's : Needs assistance/impaired     Grooming: Wash/dry  hands;Wash/dry face;Modified independent;Standing       Lower Body Bathing: Supervison/ safety;Set up;Sit to/from stand       Lower Body Dressing: Set up;Supervision/safety;Sit to/from stand   Toilet Transfer: Modified Independent   Toileting- Clothing Manipulation and Hygiene: Supervision/safety;Sit to/from stand   Tub/ Shower Transfer: Walk-in shower;Supervision/safety;Ambulation   Functional mobility during ADLs: Modified independent General ADL Comments: Pt is now able to cross ankles over knees to don/doff socks while seated.  Discussed safe method for ADLs.  Pt able to verbalize understanding of back precautions.  Reviewed safety with IADLs also.  Instructed him in use of Reacher (he has) and LH sponge/brush       Vision                     Perception     Praxis      Cognition   Behavior During Therapy: WFL for tasks assessed/performed Overall Cognitive Status: Within Functional Limits for tasks assessed                       Extremity/Trunk Assessment  Upper Extremity Assessment Upper Extremity Assessment: Overall WFL for tasks assessed   Lower Extremity Assessment Lower Extremity Assessment: RLE deficits/detail RLE Deficits / Details: Decreased strength and AROM consistent with radicular symptoms prior to surgery.   Cervical / Trunk Assessment Cervical / Trunk Assessment: Normal    Exercises     Shoulder Instructions       General Comments      Pertinent Vitals/ Pain       Pain Assessment: No/denies pain Pain Score: 3  Pain Location: back Pain Descriptors / Indicators: Operative site  guarding Pain Intervention(s): Limited activity within patient's tolerance;Monitored during session;Repositioned  Home Living Family/patient expects to be discharged to:: Private residence Living Arrangements: Non-relatives/Friends Available Help at Discharge: Friend(s);Available 24 hours/day (Girlfriend) Type of Home: Apartment Home Access: Level  entry     Home Layout: One level     Bathroom Shower/Tub: Producer, television/film/video: Handicapped height     Home Equipment: Grab bars - toilet;Grab bars - tub/shower;Cane - single point (outdoor chair he can use in shower)          Prior Functioning/Environment Level of Independence: Independent        Comments: Was walking with trunk lean and antalgic gait pattern for over a year   Frequency Min 2X/week     Progress Toward Goals  OT Goals(current goals can now be found in the care plan section)  Progress towards OT goals: Progressing toward goals  Acute Rehab OT Goals Patient Stated Goal: not stated ADL Goals Pt Will Perform Grooming: standing;with modified independence Pt Will Perform Lower Body Dressing: with modified independence;sit to/from stand;with adaptive equipment Pt Will Transfer to Toilet: with modified independence;ambulating;grab bars (elevated toilet) Pt Will Perform Toileting - Clothing Manipulation and hygiene: with modified independence;sit to/from stand Pt Will Perform Tub/Shower Transfer: Shower transfer;with modified independence;ambulating;shower seat Additional ADL Goal #1: Pt will independently verbalize 3/3 back precautions and maintain during session. Additional ADL Goal #2: Pt will don/doff back brace with setup assist.   Plan Discharge plan remains appropriate    Co-evaluation                 End of Session Equipment Utilized During Treatment: Back brace   Activity Tolerance Patient tolerated treatment well   Patient Left in chair;with call bell/phone within reach   Nurse Communication Mobility status        Time: 1044-1100 OT Time Calculation (min): 16 min  Charges: OT General Charges $OT Visit: 1 Procedure OT Treatments $Self Care/Home Management : 8-22 mins  Verdia Bolt M 06/05/2015, 11:50 AM

## 2015-06-06 NOTE — Discharge Summary (Signed)
Patient ID: [name] [name]. MRN: [id number] DOB/AGE: 08-07-1956 59 y.o.  Admit date: 06/04/2015 Discharge date: 06/06/2015  Admission Diagnoses:  Active Problems:   Back pain   Discharge Diagnoses:  Active Problems:   Back pain  status post Procedure(s): TRANSFORAMINAL LUMBAR INTERBODY FUSION (TLIF) WITH PEDICLE SCREW FIXATION 1 LEVEL TLIP L4-5  Past Medical History  Diagnosis Date  . Hypertension   . Numbness     Hx: of left hand third digit  . Arthritis     Surgeries: Procedure(s): TRANSFORAMINAL LUMBAR INTERBODY FUSION (TLIF) WITH PEDICLE SCREW FIXATION 1 LEVEL TLIP L4-5 on 06/04/2015   Consultants:    Discharged Condition: Improved  Hospital Course: [name] [name]. is an 59 y.o. 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 Patient failed conservative treatments (please see the history and physical for the specifics) and had severe unremitting pain that affects sleep, daily activities and work/hobbies. After pre-op clearance, the patient was taken to the operating room on 06/04/2015 and underwent  Procedure(s): TRANSFORAMINAL LUMBAR INTERBODY FUSION (TLIF) WITH PEDICLE SCREW FIXATION 1 LEVEL TLIP L4-5.    Patient was given perioperative antibiotics: Anti-infectives    Start     Dose/Rate Route Frequency Ordered Stop   06/04/15 1630  ceFAZolin (ANCEF) IVPB 1 g/50 mL premix     1 g 100 mL/hr over 30 Minutes Intravenous Every 8 hours 06/04/15 1345 06/05/15 0059   06/04/15 0800  ceFAZolin (ANCEF) IVPB 2 g/50 mL premix     2 g 100 mL/hr over 30 Minutes Intravenous To ShortStay Surgical 06/03/15 1336 06/04/15 0840       Patient was given sequential compression devices and early ambulation to prevent DVT.   Patient benefited maximally from hospital stay and there were no complications. At the time of discharge, the patient was urinating/moving their bowels without difficulty, tolerating a regular diet, pain is controlled with  oral pain medications and they have been cleared by PT/OT.   Recent vital signs: Patient Vitals for the past 24 hrs:  BP Temp Temp src Pulse Resp SpO2  06/06/15 0754 (!) 136/97 mmHg - - 97 18 99 %  06/06/15 0500 134/76 mmHg 99.1 F (37.3 C) Oral 97 20 97 %  06/06/15 0028 (!) 154/81 mmHg 99 F (37.2 C) Oral (!) 103 20 98 %  06/05/15 2004 128/69 mmHg (!) 100.6 F (38.1 C) Oral 73 20 99 %  06/05/15 1551 (!) 145/89 mmHg 99.7 F (37.6 C) - (!) 102 16 100 %  06/05/15 1218 139/76 mmHg 98.6 F (37 C) - 74 16 100 %  06/05/15 0821 136/87 mmHg 98.3 F (36.8 C) - 68 16 100 %     Recent laboratory studies: No results for input(s): WBC, HGB, HCT, PLT, NA, K, CL, CO2, BUN, CREATININE, GLUCOSE, INR, CALCIUM in the last 72 hours.  Invalid input(s): PT, 2   Discharge Medications:     Medication List    STOP taking these medications        gabapentin 300 MG capsule  Commonly known as:  NEURONTIN      TAKE these medications        Cholecalciferol 1000 UNITS capsule  Take 1 capsule by mouth daily.     lisinopril 10 MG tablet  Commonly known as:  PRINIVIL,ZESTRIL  Take 10 mg by mouth daily.     methocarbamol 500 MG tablet  Commonly known as:  ROBAXIN  Take 1 tablet (500 mg total) by mouth  3 (three) times daily as needed for muscle spasms.     ondansetron 4 MG tablet  Commonly known as:  ZOFRAN  Take 1 tablet (4 mg total) by mouth every 8 (eight) hours as needed for nausea or vomiting.     oxyCODONE-acetaminophen 10-325 MG per tablet  Commonly known as:  PERCOCET  Take 1 tablet by mouth every 4 (four) hours as needed for pain.        Diagnostic Studies: Dg Lumbar Spine 2-3 Views  06/05/2015   CLINICAL DATA:  History of lumbar fusion on June 04, 2015  EXAM: LUMBAR SPINE - 2-3 VIEW  COMPARISON:  Fluoro spot films from June 04, 2015  FINDINGS: The patient has undergone posterior fusion and intradiscal device placement at L4-5. Radiographic positioning of the metallic hardware is  good. The disc space heights are preserved. There is no compression fracture. The pedicles are intact where visualized.  IMPRESSION: There is no postprocedure complication following L4-L5 posterior fusion and interdiscal device placement.   Electronically Signed   By: David  [place] M.D.   On: 06/05/2015 07:38   Dg Lumbar Spine 2-3 Views  06/04/2015   CLINICAL DATA:  Lumbar disc disease.  Spondylolisthesis.  EXAM: DG C-ARM GT 120 MIN; LUMBAR SPINE - 2-3 VIEW  FLUOROSCOPY TIME:  Fluoroscopy Time (in minutes and seconds): 3 minutes 3 seconds  COMPARISON:  Lumbar MRI dated 01/02/2015  FINDINGS: AP and lateral C-arm images demonstrate the patient has gone undergone interbody and posterior fusion at L4-5. Minimal residual spondylolisthesis.  IMPRESSION: Fusion performed at L4-5.   Electronically Signed   By: Francene Boyers M.D.   On: 06/04/2015 12:53   Dg C-arm Gt 120 Min  06/04/2015   CLINICAL DATA:  Lumbar disc disease.  Spondylolisthesis.  EXAM: DG C-ARM GT 120 MIN; LUMBAR SPINE - 2-3 VIEW  FLUOROSCOPY TIME:  Fluoroscopy Time (in minutes and seconds): 3 minutes 3 seconds  COMPARISON:  Lumbar MRI dated 01/02/2015  FINDINGS: AP and lateral C-arm images demonstrate the patient has gone undergone interbody and posterior fusion at L4-5. Minimal residual spondylolisthesis.  IMPRESSION: Fusion performed at L4-5.   Electronically Signed   By: Francene Boyers M.D.   On: 06/04/2015 12:53          Follow-up Information    Follow up with Alvy Beal, MD. Schedule an appointment as soon as possible for a visit in 2 weeks.   Specialty:  Orthopedic Surgery   Why:  For suture removal, For wound re-check   Contact information:   [address] [place] [address] [phone]       Discharge Plan:  discharge to home  Disposition: uneventful hospital course.  Pain controlled with oral meds, radicular pain significantly improved.      Signed: Venita Lick D for Dr. Venita Lick [facility] [phone] 06/06/2015, 8:10 AM

## 2015-06-06 NOTE — Progress Notes (Signed)
    Subjective: Procedure(s) (LRB): TRANSFORAMINAL LUMBAR INTERBODY FUSION (TLIF) WITH PEDICLE SCREW FIXATION 1 LEVEL TLIP L4-5 (N/A) 2 Days Post-Op  Patient reports pain as 2 on 0-10 scale.  Reports none leg pain reports incisional back pain   Positive void Positive bowel movement Positive flatus Negative chest pain or shortness of breath  Objective: Vital signs in last 24 hours: Temp:  [98.3 F (36.8 C)-100.6 F (38.1 C)] 99.1 F (37.3 C) (07/22 0500) Pulse Rate:  [68-103] 97 (07/22 0754) Resp:  [16-20] 18 (07/22 0754) BP: (128-154)/(69-97) 136/97 mmHg (07/22 0754) SpO2:  [97 %-100 %] 99 % (07/22 0754)  Intake/Output from previous day:    Labs: No results for input(s): WBC, RBC, HCT, PLT in the last 72 hours. No results for input(s): NA, K, CL, CO2, BUN, CREATININE, GLUCOSE, CALCIUM in the last 72 hours. No results for input(s): LABPT, INR in the last 72 hours.  Physical Exam: Neurologically intact Neurovascular intact Intact pulses distally Dorsiflexion/Plantar flexion intact Incision: dressing C/D/I Compartment soft no radicular leg pain  Assessment/Plan: Patient stable  xrays satisfactory Continue mobilization with physical therapy Continue care  Advance diet Up with therapy  doiung well - plan on d/c to home  Venita Lick, MD [facility] [phone]

## 2015-06-06 NOTE — Progress Notes (Signed)
Pt doing well. Pt given D/C instructions with Rx's, verbal understanding was provided. Pt's incision is covered and is clean and dry. Pt's IV was removed prior to D/C. Pt has lumbar corsett in place. Pt D/C'd home via wheelchair @ 0930 per MD order. Pt is stable @ D/C and has no other needs at this time. Rema Fendt, RN

## 2015-06-06 NOTE — Progress Notes (Signed)
PT Cancellation Note and Discharge  Patient Details Name: [name] [name]. MRN: [id number] DOB: [birth year]   Cancelled Treatment:    Reason Eval/Treat Not Completed: Pt to d/c this morning and is currently dressed and ready to go. Reviewed back precautions, recommendations for general safety awareness, and plan for OPPT per post-op protocol. Pt states he has been ambulating independently around the unit this morning. Will defer further therapy to outpatient.    Conni Slipper 06/06/2015, 8:41 AM   Conni Slipper, PT, DPT Acute Rehabilitation Services Pager: [phone]

## 2015-06-09 ENCOUNTER — Encounter (HOSPITAL_COMMUNITY): Payer: Self-pay | Admitting: Orthopedic Surgery

## 2016-03-01 ENCOUNTER — Encounter: Payer: Self-pay | Admitting: *Deleted

## 2016-03-10 ENCOUNTER — Encounter: Payer: Self-pay | Admitting: *Deleted

## 2016-03-11 ENCOUNTER — Ambulatory Visit
Admission: RE | Admit: 2016-03-11 | Discharge: 2016-03-11 | Disposition: A | Discharge status: Home / Self Care | Payer: 59 | Source: Ambulatory Visit | Attending: Ophthalmology | Admitting: Ophthalmology

## 2016-03-11 ENCOUNTER — Ambulatory Visit: Payer: 59 | Admitting: Anesthesiology

## 2016-03-11 ENCOUNTER — Encounter: Admission: RE | Payer: Self-pay | Source: Ambulatory Visit

## 2016-03-11 ENCOUNTER — Encounter: Payer: Self-pay | Admitting: *Deleted

## 2016-03-11 ENCOUNTER — Encounter
Admission: RE | Disposition: A | Discharge status: Home / Self Care | Payer: Self-pay | Source: Ambulatory Visit | Attending: Ophthalmology

## 2016-03-11 ENCOUNTER — Ambulatory Visit: Admission: RE | Admit: 2016-03-11 | Payer: 59 | Source: Ambulatory Visit | Admitting: Ophthalmology

## 2016-03-11 DIAGNOSIS — F17290 Nicotine dependence, other tobacco product, uncomplicated: Secondary | ICD-10-CM | POA: Diagnosis not present

## 2016-03-11 DIAGNOSIS — H269 Unspecified cataract: Secondary | ICD-10-CM | POA: Diagnosis present

## 2016-03-11 DIAGNOSIS — Z79899 Other long term (current) drug therapy: Secondary | ICD-10-CM | POA: Diagnosis not present

## 2016-03-11 DIAGNOSIS — H2589 Other age-related cataract: Secondary | ICD-10-CM | POA: Insufficient documentation

## 2016-03-11 DIAGNOSIS — I10 Essential (primary) hypertension: Secondary | ICD-10-CM | POA: Diagnosis not present

## 2016-03-11 DIAGNOSIS — Z9889 Other specified postprocedural states: Secondary | ICD-10-CM | POA: Diagnosis not present

## 2016-03-11 HISTORY — PX: CATARACT EXTRACTION W/PHACO: SHX586

## 2016-03-11 SURGERY — PHACOEMULSIFICATION, CATARACT, WITH IOL INSERTION
Anesthesia: Monitor Anesthesia Care | Site: Eye | Laterality: Left | Wound class: Clean

## 2016-03-11 SURGERY — PHACOEMULSIFICATION, CATARACT, WITH IOL INSERTION
Anesthesia: Choice | Laterality: Left

## 2016-03-11 MED ORDER — NEOMYCIN-POLYMYXIN-DEXAMETH 0.1 % OP OINT
TOPICAL_OINTMENT | OPHTHALMIC | Status: DC | PRN
  Administered 2016-03-11: 1 via OPHTHALMIC

## 2016-03-11 MED ORDER — LIDOCAINE HCL (PF) 4 % IJ SOLN
INTRAMUSCULAR | Status: AC
  Filled 2016-03-11: qty 5

## 2016-03-11 MED ORDER — EPINEPHRINE HCL 1 MG/ML IJ SOLN
INTRAMUSCULAR | Status: AC
  Filled 2016-03-11: qty 1

## 2016-03-11 MED ORDER — CEFUROXIME OPHTHALMIC INJECTION 1 MG/0.1 ML
INJECTION | OPHTHALMIC | Status: AC
  Filled 2016-03-11: qty 0.1

## 2016-03-11 MED ORDER — TETRACAINE HCL 0.5 % OP SOLN
1.0000 [drp] | Freq: Once | OPHTHALMIC | Status: AC
  Administered 2016-03-11: 1 [drp] via OPHTHALMIC

## 2016-03-11 MED ORDER — LIDOCAINE HCL (PF) 4 % IJ SOLN
INTRAOCULAR | Status: DC | PRN
  Administered 2016-03-11: 10:00:00 via OPHTHALMIC

## 2016-03-11 MED ORDER — HYDRALAZINE HCL 20 MG/ML IJ SOLN
INTRAMUSCULAR | Status: AC
  Filled 2016-03-11: qty 1

## 2016-03-11 MED ORDER — CARBACHOL 0.01 % IO SOLN
INTRAOCULAR | Status: DC | PRN
  Administered 2016-03-11: 0.5 mL via INTRAOCULAR

## 2016-03-11 MED ORDER — SODIUM CHLORIDE 0.9 % IV SOLN
INTRAVENOUS | Status: DC
  Administered 2016-03-11: 09:00:00 via INTRAVENOUS

## 2016-03-11 MED ORDER — TRYPAN BLUE 0.06 % OP SOLN
OPHTHALMIC | Status: DC | PRN
  Administered 2016-03-11: 0.5 mL via INTRAOCULAR

## 2016-03-11 MED ORDER — MIDAZOLAM HCL 2 MG/2ML IJ SOLN
INTRAMUSCULAR | Status: DC | PRN
  Administered 2016-03-11: 1 mg via INTRAVENOUS

## 2016-03-11 MED ORDER — BUPIVACAINE HCL (PF) 0.75 % IJ SOLN
INTRAMUSCULAR | Status: AC
  Filled 2016-03-11: qty 10

## 2016-03-11 MED ORDER — NA HYALUR & NA CHOND-NA HYALUR 0.4-0.35 ML IO KIT
PACK | INTRAOCULAR | Status: DC | PRN
  Administered 2016-03-11: .35 mL via INTRAOCULAR

## 2016-03-11 MED ORDER — HYALURONIDASE HUMAN 150 UNIT/ML IJ SOLN
INTRAMUSCULAR | Status: AC
  Filled 2016-03-11: qty 1

## 2016-03-11 MED ORDER — TETRACAINE HCL 0.5 % OP SOLN
OPHTHALMIC | Status: AC
  Filled 2016-03-11: qty 2

## 2016-03-11 MED ORDER — CEFUROXIME OPHTHALMIC INJECTION 1 MG/0.1 ML
INJECTION | OPHTHALMIC | Status: DC | PRN
  Administered 2016-03-11: 0.1 mL via INTRACAMERAL

## 2016-03-11 MED ORDER — HYDRALAZINE HCL 20 MG/ML IJ SOLN
12.5000 mg | Freq: Once | INTRAMUSCULAR | Status: AC
  Administered 2016-03-11: 13 mg via INTRAVENOUS

## 2016-03-11 MED ORDER — BSS IO SOLN
INTRAOCULAR | Status: DC | PRN
  Administered 2016-03-11: 10:00:00 via OPHTHALMIC

## 2016-03-11 MED ORDER — TRYPAN BLUE 0.06 % OP SOLN
OPHTHALMIC | Status: AC
  Filled 2016-03-11: qty 0.5

## 2016-03-11 MED ORDER — POVIDONE-IODINE 5 % OP SOLN
1.0000 "application " | Freq: Once | OPHTHALMIC | Status: AC
  Administered 2016-03-11: 1 via OPHTHALMIC

## 2016-03-11 MED ORDER — HYDRALAZINE HCL 20 MG/ML IJ SOLN
INTRAMUSCULAR | Status: AC
  Administered 2016-03-11: 12.5 mg via INTRAVENOUS
  Filled 2016-03-11: qty 1

## 2016-03-11 MED ORDER — ARMC OPHTHALMIC DILATING GEL
1.0000 "application " | OPHTHALMIC | Status: AC | PRN
  Administered 2016-03-11 (×2): 1 via OPHTHALMIC

## 2016-03-11 MED ORDER — FENTANYL CITRATE (PF) 100 MCG/2ML IJ SOLN
INTRAMUSCULAR | Status: DC | PRN
Start: 2016-03-11 — End: 2016-03-11
  Administered 2016-03-11: 1 ug via INTRAVENOUS

## 2016-03-11 MED ORDER — MOXIFLOXACIN HCL 0.5 % OP SOLN
1.0000 [drp] | OPHTHALMIC | Status: DC | PRN
Start: 2016-03-11 — End: 2016-03-11

## 2016-03-11 MED ORDER — MOXIFLOXACIN HCL 0.5 % OP SOLN
OPHTHALMIC | Status: AC
  Filled 2016-03-11: qty 3

## 2016-03-11 MED ORDER — HYDRALAZINE HCL 20 MG/ML IJ SOLN
12.5000 mg | Freq: Once | INTRAMUSCULAR | Status: AC
  Administered 2016-03-11: 12.5 mg via INTRAVENOUS

## 2016-03-11 MED ORDER — SODIUM HYALURONATE 10 MG/ML IO SOLN
INTRAOCULAR | Status: AC
  Filled 2016-03-11: qty 0.85

## 2016-03-11 MED ORDER — NA HYALUR & NA CHOND-NA HYALUR 0.55-0.5 ML IO KIT
PACK | INTRAOCULAR | Status: AC
  Filled 2016-03-11: qty 1.05

## 2016-03-11 MED ORDER — SODIUM HYALURONATE 23 MG/ML IO SOLN
INTRAOCULAR | Status: DC | PRN
  Administered 2016-03-11: 0.6 mL via INTRAOCULAR

## 2016-03-11 MED ORDER — POVIDONE-IODINE 5 % OP SOLN
OPHTHALMIC | Status: DC
Start: 2016-03-11 — End: 2016-03-11
  Filled 2016-03-11: qty 30

## 2016-03-11 SURGICAL SUPPLY — 22 items
CANNULA ANT/CHMB 27GA (MISCELLANEOUS) ×9 IMPLANT
CUP MEDICINE 2OZ PLAST GRAD ST (MISCELLANEOUS) ×3 IMPLANT
GLOVE BIO SURGEON STRL SZ8 (GLOVE) ×3 IMPLANT
GLOVE BIOGEL M 6.5 STRL (GLOVE) ×3 IMPLANT
GLOVE SURG LX 7.5 STRW (GLOVE) ×2
GLOVE SURG LX STRL 7.5 STRW (GLOVE) ×1 IMPLANT
GOWN STRL REUS W/ TWL LRG LVL3 (GOWN DISPOSABLE) ×2 IMPLANT
GOWN STRL REUS W/TWL LRG LVL3 (GOWN DISPOSABLE) ×4
LENS IOL TECNIS 9.0 (Intraocular Lens) ×3 IMPLANT
LENS IOL TECNIS MONO 1P 9.0 (Intraocular Lens) ×1 IMPLANT
PACK CATARACT (MISCELLANEOUS) ×3 IMPLANT
PACK CATARACT BRASINGTON LX (MISCELLANEOUS) ×3 IMPLANT
PACK EYE AFTER SURG (MISCELLANEOUS) ×3 IMPLANT
SOL BSS BAG (MISCELLANEOUS) ×3
SOL PREP PVP 2OZ (MISCELLANEOUS) ×3
SOLUTION BSS BAG (MISCELLANEOUS) ×1 IMPLANT
SOLUTION PREP PVP 2OZ (MISCELLANEOUS) ×1 IMPLANT
SYR 3ML LL SCALE MARK (SYRINGE) ×6 IMPLANT
SYR 5ML LL (SYRINGE) ×3 IMPLANT
SYR TB 1ML 27GX1/2 LL (SYRINGE) ×3 IMPLANT
WATER STERILE IRR 1000ML POUR (IV SOLUTION) ×3 IMPLANT
WIPE NON LINTING 3.25X3.25 (MISCELLANEOUS) ×3 IMPLANT

## 2016-03-11 NOTE — Progress Notes (Signed)
Notified Dr. Karlton LemonKarenz about pt BP at this time. Dr. Karlton LemonKarenz reports that pt is free to go and to follow -up with PCP to get BP under control. Pt takes BP medication and will follow up with his PCP and call for appointment today. Pt is alert, calm, pleasant and reports that he is ready to go. Pt denies pain and any additional complaints and has no s/s of distress. S/O is at the bedside. Will continue to monitor and tx pt according to MD orders.

## 2016-03-11 NOTE — Op Note (Signed)
OPERATIVE NOTE  [name] Maudry MayhewLeath Jr. [id number] 03/11/2016   PREOPERATIVE DIAGNOSIS:          Mature (Total) Cataract Left Eye H25.89   POSTOPERATIVE DIAGNOSIS: same          PROCEDURE:  Phacoemusification with posterior chamber intraocular lens placement of the right eye .  Vision Blue dye was used to stain the lens capsule.  LENS:   Implant Name Type Inv. Item Serial No. Manufacturer Lot No. LRB No. Used  [id number]     [id number] [facility]   Left 1    9.01 D PCIOL   ULTRASOUND TIME: 10 of 0 minutes 43 seconds, CDE 4.3  SURGEON:  Deirdre Evenerhadwick R. Luellen Howson, MD   ANESTHESIA:  Topical with tetracaine drops and 2% Xylocaine jelly, augmented with 1% preservative-free intracameral lidocaine.   COMPLICATIONS:  None.   DESCRIPTION OF PROCEDURE:  The patient was identified in the holding room and transported to the operating room and placed in the supine position under the operating microscope. Theleft eye was identified as the operative eye and it was prepped and draped in the usual sterile ophthalmic fashion.  A 1 millimeter clear-corneal paracentesis was made at the 1:30 position.  0.5 ml of preservative-free 1% lidocaine was injected into the anterior chamber. The anterior chamber was filled with Healon 5 viscoelastic.  A 2.4 millimeter keratome was used to make a near-clear corneal incision at the 10:30 position.  The anterior chamber was filled with Healon 5 viscoelastic.  Vision Blue dye was then injected under the viscoelastic to stain the lens capsule.  BSS was then used to wash the dye out.  Additional Healon 5 was placed into the anterior chamber.  A curvilinear capsulorrhexis was made with a cystotome and capsulorrhexis forceps.  Balanced salt solution was used to hydrodissect and hydrodelineate the nucleus.  Viscoat was then placed in the anterior chamber.   Phacoemulsification was then used in stop and chop fashion to remove the lens nucleus and epinucleus.  The remaining cortex was  then removed using the irrigation and aspiration handpiece. Provisc was then placed into the capsular bag to distend it for lens placement.  A 9.0 -diopter lens was then injected into the capsular bag.  The remaining viscoelastic was aspirated.   Wounds were hydrated with balanced salt solution.  The anterior chamber was inflated to a physiologic pressure with balanced salt solution. Cefuroxime 0.1 ml of a 10mg /ml solution was injected into the anterior chamber for a dose of 1 mg of intracameral antibiotic at the completion of the case. Miostat was placed into the anterior chamber to constrict the pupil.  No wound leaks were noted.  Topical Vigamox drops and Maxitrol ointment were applied to the eye.  The patient was taken to the recovery room in stable condition without complications of anesthesia or surgery.  [name] [name] 03/11/2016, 10:43 AM

## 2016-03-11 NOTE — OR Nursing (Signed)
Dr Karlton LemonKarenz in to see pt regarding elevated BP. States watch pt for a few minutes then retake and call if continues to be elevated.

## 2016-03-11 NOTE — Anesthesia Preprocedure Evaluation (Signed)
Anesthesia Evaluation  Patient identified by MRN, date of birth, ID band Patient awake    Reviewed: Allergy & Precautions, H&P , NPO status , Patient's Chart, lab work & pertinent test results, reviewed documented beta blocker date and time   History of Anesthesia Complications Negative for: history of anesthetic complications  Airway Mallampati: III  TM Distance: >3 FB Neck ROM: full    Dental no notable dental hx. (+) Partial Lower, Teeth Intact   Pulmonary neg pulmonary ROS, former smoker,    Pulmonary exam normal breath sounds clear to auscultation       Cardiovascular Exercise Tolerance: Good hypertension, (-) angina(-) CAD, (-) Past MI, (-) Cardiac Stents and (-) CABG Normal cardiovascular exam(-) dysrhythmias (-) Valvular Problems/Murmurs Rhythm:regular Rate:Normal     Neuro/Psych negative neurological ROS  negative psych ROS   GI/Hepatic negative GI ROS, Neg liver ROS,   Endo/Other  negative endocrine ROS  Renal/GU negative Renal ROS  negative genitourinary   Musculoskeletal   Abdominal   Peds  Hematology negative hematology ROS (+)   Anesthesia Other Findings Past Medical History:   Hypertension                                                 Numbness                                                       Comment:Hx: of left hand third digit   Arthritis                                                    Reproductive/Obstetrics negative OB ROS                             Anesthesia Physical Anesthesia Plan  ASA: II  Anesthesia Plan: MAC   Post-op Pain Management:    Induction:   Airway Management Planned:   Additional Equipment:   Intra-op Plan:   Post-operative Plan:   Informed Consent: I have reviewed the patients History and Physical, chart, labs and discussed the procedure including the risks, benefits and alternatives for the proposed anesthesia with the  patient or authorized representative who has indicated his/her understanding and acceptance.   Dental Advisory Given  Plan Discussed with: Anesthesiologist, CRNA and Surgeon  Anesthesia Plan Comments:         Anesthesia Quick Evaluation

## 2016-03-11 NOTE — Transfer of Care (Signed)
Immediate Anesthesia Transfer of Care Note  Patient: [name] CoolerAlfonzo Dugo Jr.  Procedure(s) Performed: Procedure(s) with comments: CATARACT EXTRACTION PHACO AND INTRAOCULAR LENS PLACEMENT (IOC) (Left) - US 42.8 AP% 10.01 CDE 4.31 Fluid pack lot # [id number] H  Patient Location: Short Stay  Anesthesia Type:MAC  Level of Consciousness: awake, alert  and oriented  Airway & Oxygen Therapy: Patient Spontanous Breathing and Patient connected to face mask oxygen  Post-op Assessment: Report given to RN and Post -op Vital signs reviewed and unstable, Anesthesiologist notified  Post vital signs: Reviewed  Last Vitals: Filed Vitals:   03/11/16 0851 03/11/16 1045  BP: 161/94 214/130  Pulse: 55 45  Temp: 36.7 C 36.9 C  Resp: 16     Last Pain: There were no vitals filed for this visit.       Complications: No apparent anesthesia complications

## 2016-03-11 NOTE — Anesthesia Postprocedure Evaluation (Signed)
Anesthesia Post Note  Patient: [name] CoolerAlfonzo Soy Jr.  Procedure(s) Performed: Procedure(s) (LRB): CATARACT EXTRACTION PHACO AND INTRAOCULAR LENS PLACEMENT (IOC) (Left)  Patient location during evaluation: PACU Anesthesia Type: MAC Level of consciousness: awake and alert Pain management: pain level controlled Vital Signs Assessment: post-procedure vital signs reviewed and stable Respiratory status: spontaneous breathing, nonlabored ventilation, respiratory function stable and patient connected to nasal cannula oxygen Cardiovascular status: blood pressure returned to baseline and stable Postop Assessment: no signs of nausea or vomiting Anesthetic complications: no Comments: Patient initially was found to be hypertensive to the 200s/100s, so there patient received 2 doses of hydralazine.  Once BP was under better control it was felt that [name] [name] was safe for discharge.    Last Vitals:  Filed Vitals:   03/11/16 1143 03/11/16 1153  BP: 174/75 196/81  Pulse: 57 67  Temp:    Resp: 15 15    Last Pain:  Filed Vitals:   03/11/16 1154  PainSc: 0-No pain                 Lenard SimmerAndrew Kamarian Sahakian

## 2016-03-11 NOTE — H&P (Signed)
  The History and Physical notes are on paper, have been signed, and are to be scanned. The patient remains stable and unchanged from the H&P.   Previous H&P reviewed, patient examined, and there are no changes.  [name] [name] 03/11/2016 9:39 AM

## 2016-03-11 NOTE — Discharge Instructions (Signed)
AMBULATORY SURGERY  °DISCHARGE INSTRUCTIONS ° ° °1) The drugs that you were given will stay in your system until tomorrow so for the next 24 hours you should not: ° °A) Drive an automobile °B) Make any legal decisions °C) Drink any alcoholic beverage ° ° °2) You may resume regular meals tomorrow.  Today it is better to start with liquids and gradually work up to solid foods. ° °You may eat anything you prefer, but it is better to start with liquids, then soup and crackers, and gradually work up to solid foods. ° ° °3) Please notify your doctor immediately if you have any unusual bleeding, trouble breathing, redness and pain at the surgery site, drainage, fever, or pain not relieved by medication. ° ° ° °4) Additional Instructions: ° ° ° ° ° ° ° °Please contact your physician with any problems or Same Day Surgery at [phone], Monday through Friday 6 am to 4 pm, or [facility] Main number at [phone].Eye Surgery Discharge Instructions ° °Expect mild scratchy sensation or mild soreness. °DO NOT RUB YOUR EYE! ° °The day of surgery: °• Minimal physical activity, but bed rest is not required °• No reading, computer work, or close hand work °• No bending, lifting, or straining. °• May watch TV ° °For 24 hours: °• No driving, legal decisions, or alcoholic beverages °• Safety precautions °• Eat anything you prefer: It is better to start with liquids, then soup then solid foods. °• _____ Eye patch should be worn until postoperative exam tomorrow. °• ____ Solar shield eyeglasses should be worn for comfort in the sunlight/patch while sleeping ° °Resume all regular medications including aspirin or Coumadin if these were discontinued prior to surgery. °You may shower, bathe, shave, or wash your hair. °Tylenol may be taken for mild discomfort. ° °Call your doctor if you experience significant pain, nausea, or vomiting, fever > 101 or other signs of infection. [phone] or [phone] °Specific  instructions: ° ° °

## 2016-03-11 NOTE — OR Nursing (Signed)
BP continues to b e elevated Dr Karlton LemonKarenz called ordered 12.5mg  hydralazine iv once.

## 2016-04-08 ENCOUNTER — Encounter: Payer: Self-pay | Admitting: Ophthalmology

## 2016-05-28 ENCOUNTER — Encounter: Payer: Self-pay | Admitting: Ophthalmology

## 2017-03-13 IMAGING — DX DG LUMBAR SPINE 2-3V
2 series · 2 of 2 positions shown · non-contrast
Comparison: Fluoro spot films from June 04, 2015

CLINICAL DATA: History of lumbar fusion on June 04, 2015

EXAM:
LUMBAR SPINE - 2-3 VIEW

[l-spine ap]
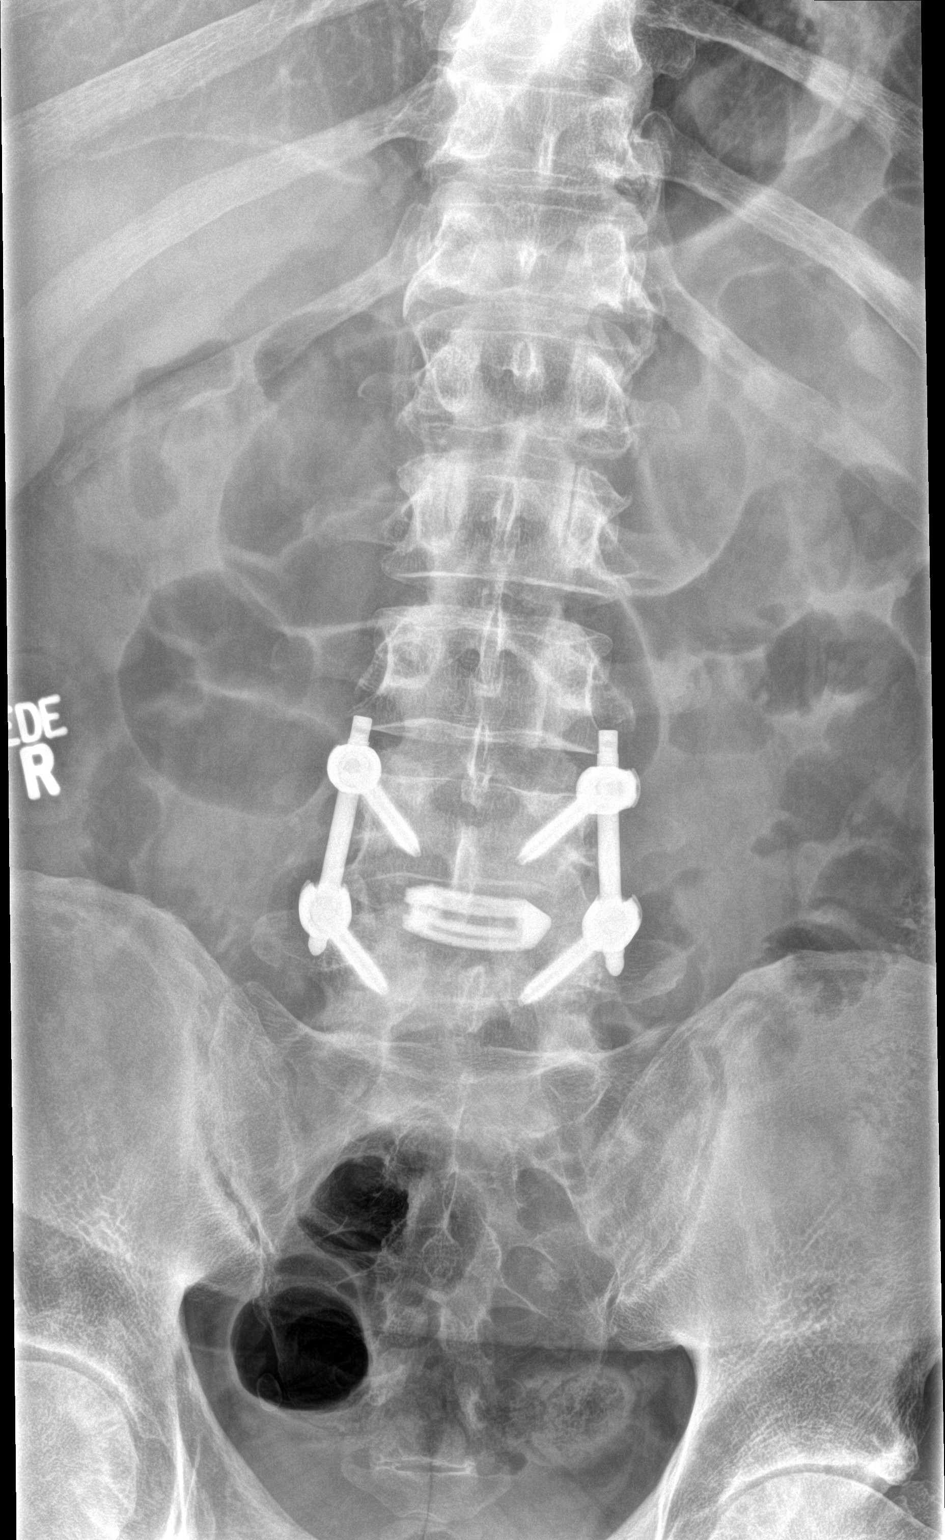

[l-spine lat]
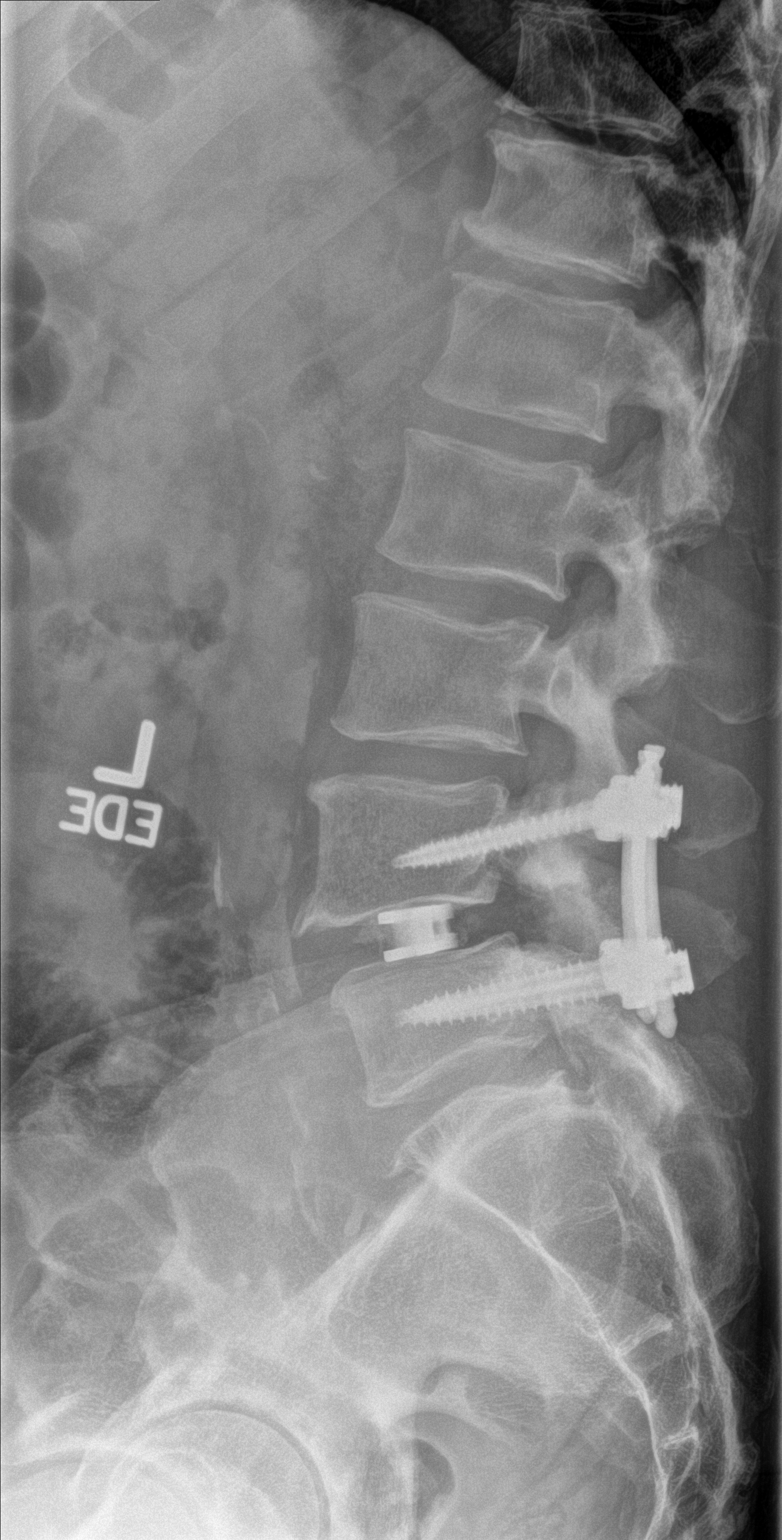

[2 of 2 positions shown; findings below may reference images not displayed]

FINDINGS: The patient has undergone posterior fusion and intradiscal device
placement at L4-5. Radiographic positioning of the metallic hardware
is good. The disc space heights are preserved. There is no
compression fracture. The pedicles are intact where visualized.
IMPRESSION: There is no postprocedure complication following L4-L5 posterior
fusion and interdiscal device placement.

## 2017-08-30 ENCOUNTER — Ambulatory Visit: Payer: Self-pay

## 2017-08-31 ENCOUNTER — Ambulatory Visit (INDEPENDENT_AMBULATORY_CARE_PROVIDER_SITE_OTHER): Payer: 59 | Admitting: Urology

## 2017-08-31 ENCOUNTER — Encounter: Payer: Self-pay | Admitting: Urology

## 2017-08-31 VITALS — BP 167/120 | HR 114 | Ht 74.0 in | Wt 219.3 lb

## 2017-08-31 DIAGNOSIS — Z125 Encounter for screening for malignant neoplasm of prostate: Secondary | ICD-10-CM

## 2017-08-31 DIAGNOSIS — N529 Male erectile dysfunction, unspecified: Secondary | ICD-10-CM

## 2017-08-31 NOTE — Progress Notes (Signed)
08/31/2017 9:28 AM   [name] Maudry MayhewLeath Jr. [birth year] [id number]  Referring provider: Lauro RegulusAnderson, [place] W, MD [address] [facility] [place], [place] [address]  Chief Complaint  Patient presents with  . Erectile Dysfunction    HPI: The patient is a 61 year old gentleman presents today for evaluation of erectile dysfunction  1. Erectile dysfunction The patient is unable to obtain an erection. He has tried both full dose Cialis and full dose Viagra with no effect. She denies cardiac issues. He can walk up 2 flights of stairs without difficulty. He is not on nitrates.  2. Prostate cancer screening PSA 0.54 in September 2018.     PMH: Past Medical History:  Diagnosis Date  . Arthritis   . Hypertension   . Numbness    Hx: of left hand third digit    Surgical History: Past Surgical History:  Procedure Laterality Date  . BACK SURGERY    . CATARACT EXTRACTION W/PHACO Left 03/11/2016   Procedure: CATARACT EXTRACTION PHACO AND INTRAOCULAR LENS PLACEMENT (IOC);  Surgeon: Lockie Molahadwick Brasington, MD;  Location: [facility] ORS;  Service: Ophthalmology;  Laterality: Left;  US 42.8 AP% 10.01 CDE 4.31 Fluid pack lot # [id number] H  . COLONOSCOPY W/ BIOPSIES AND POLYPECTOMY     Hx; of  . KNEE ARTHROSCOPY    . KNEE SURGERY Left 1978  . KNEE SURGERY Right 1982    Home Medications:  Allergies as of 08/31/2017   No Known Allergies     Medication List       Accurate as of 08/31/17  9:28 AM. Always use your most recent med list.          Cholecalciferol 1000 units capsule Take 1 capsule by mouth daily.   lisinopril 10 MG tablet Commonly known as:  PRINIVIL,ZESTRIL Take 10 mg by mouth daily.       Allergies: No Known Allergies  Family History: Family History  Problem Relation Age of Onset  . High blood pressure Mother   . Cancer Father   . Sickle cell trait Sister   . Prostate cancer Neg Hx   . Bladder Cancer Neg Hx   . Kidney cancer Neg Hx     Social  History:  reports that he has been smoking Cigars.  He has smoked for the past 20.00 years. He has never used smokeless tobacco. He reports that he drinks alcohol. He reports that he does not use drugs.  ROS: UROLOGY Frequent Urination?: Yes Hard to postpone urination?: Yes Burning/pain with urination?: Yes Get up at night to urinate?: Yes Leakage of urine?: No Urine stream starts and stops?: No Trouble starting stream?: No Do you have to strain to urinate?: No Blood in urine?: No Urinary tract infection?: No Sexually transmitted disease?: No Injury to kidneys or bladder?: No Painful intercourse?: No Weak stream?: No Erection problems?: No Penile pain?: No  Gastrointestinal Nausea?: No Vomiting?: No Indigestion/heartburn?: No Diarrhea?: No Constipation?: No  Constitutional Fever: No Night sweats?: Yes Weight loss?: No Fatigue?: No  Skin Skin rash/lesions?: No Itching?: No  Eyes Blurred vision?: No Double vision?: No  Ears/Nose/Throat Sore throat?: No Sinus problems?: No  Hematologic/Lymphatic Swollen glands?: No Easy bruising?: No  Cardiovascular Leg swelling?: No Chest pain?: No  Respiratory Cough?: No Shortness of breath?: No  Endocrine Excessive thirst?: No  Musculoskeletal Back pain?: No Joint pain?: No  Neurological Headaches?: No Dizziness?: No  Psychologic Depression?: No Anxiety?: No  Physical Exam: BP (!) 167/120 (BP Location: Right Arm, Patient  Position: Sitting, Cuff Size: Normal)   Pulse (!) 114   Ht 6\' 2"  (1.88 m)   Wt 219 lb 4.8 oz (99.5 kg)   BMI 28.16 kg/m   Constitutional:  Alert and oriented, No acute distress. HEENT: [place] AT, moist mucus membranes.  Trachea midline, no masses. Cardiovascular: No clubbing, cyanosis, or edema. Respiratory: Normal respiratory effort, no increased work of breathing. GI: Abdomen is soft, nontender, nondistended, no abdominal masses GU: No CVA tenderness. Normal phallus. Testicles  descended bilaterally. Benign. DRE: 2+ smooth benign. Skin: No rashes, bruises or suspicious lesions. Lymph: No cervical or inguinal adenopathy. Neurologic: Grossly intact, no focal deficits, moving all 4 extremities. Psychiatric: Normal mood and affect.  Laboratory Data: Lab Results  Component Value Date   WBC 7.0 05/26/2015   HGB 15.1 05/26/2015   HCT 42.5 05/26/2015   MCV 100.7 (H) 05/26/2015   PLT 234 05/26/2015    Lab Results  Component Value Date   CREATININE 1.04 05/26/2015    No results found for: PSA  No results found for: TESTOSTERONE  No results found for: HGBA1C  Urinalysis No results found for: COLORURINE, APPEARANCEUR, LABSPEC, PHURINE, GLUCOSEU, HGBUR, BILIRUBINUR, KETONESUR, PROTEINUR, UROBILINOGEN, NITRITE, LEUKOCYTESUR  Assessment & Plan:    1. Erectile dysfunction I discussed treatment options with the patient for his ED. This includes another PDE5 inhibitor, MUSE, vacuum erection device, penile injection therapy, and penile prosthesis. We discussed the risks and benefits of each. He is elected to proceed with penile injection therapy. We will order the test dose of trimix and have him follow-up in a few weeks for teaching. He was warned of risk of priapism the need for emergent intervention.  2. Prostate cancer screening Up-to-date   Return for 1-2 weeks trimix teaching.  Hildred Laser, MD  [facility] [address], [address] [place], [place] [address] [phone]

## 2017-09-14 ENCOUNTER — Ambulatory Visit: Payer: 59 | Admitting: Urology

## 2017-09-14 ENCOUNTER — Encounter: Payer: Self-pay | Admitting: Urology

## 2017-09-14 VITALS — BP 152/99 | HR 118 | Ht 74.0 in | Wt 216.2 lb

## 2017-09-14 DIAGNOSIS — N529 Male erectile dysfunction, unspecified: Secondary | ICD-10-CM

## 2017-09-14 MED ORDER — SILDENAFIL CITRATE 20 MG PO TABS: ORAL_TABLET | ORAL | 3 refills | Status: DC

## 2017-09-14 NOTE — Progress Notes (Signed)
09/14/2017 9:44 AM   [name] [name]. [birth year] [id number]  Referring provider: Lauro Regulus, MD [address] [facility] [place] [facility] [place], [place] [address]  Chief Complaint  Patient presents with  . Follow-up    trimix teaching    HPI: The patient is a 61 year old gentleman presents today for trimix teaching.  1. Erectile dysfunction The patient is unable to obtain an erection. He has tried both full dose Cialis and full dose Viagra with no effect. He denies cardiac issues. He can walk up 2 flights of stairs without difficulty. He is not on nitrates.  Patient presents today for Trimix teaching  2. Prostate cancer screening PSA 0.54 in September 2018.       PMH: Past Medical History:  Diagnosis Date  . Arthritis   . Hypertension   . Numbness    Hx: of left hand third digit    Surgical History: Past Surgical History:  Procedure Laterality Date  . BACK SURGERY    . CATARACT EXTRACTION W/PHACO Left 03/11/2016   Procedure: CATARACT EXTRACTION PHACO AND INTRAOCULAR LENS PLACEMENT (IOC);  Surgeon: Lockie Mola, MD;  Location: [facility] ORS;  Service: Ophthalmology;  Laterality: Left;  [place] 42.8 AP% 10.01 CDE 4.31 Fluid pack lot # [id number] H  . COLONOSCOPY W/ BIOPSIES AND POLYPECTOMY     Hx; of  . KNEE ARTHROSCOPY    . KNEE SURGERY Left 1978  . KNEE SURGERY Right 1982    Home Medications:  Allergies as of 09/14/2017   No Known Allergies     Medication List       Accurate as of 09/14/17  9:44 AM. Always use your most recent med list.          Cholecalciferol 1000 units capsule Take 1 capsule by mouth daily.   lisinopril 10 MG tablet Commonly known as:  PRINIVIL,ZESTRIL Take 10 mg by mouth daily.   sildenafil 20 MG tablet Commonly known as:  REVATIO Take 1 to 5 tabs PO daily prn       Allergies: No Known Allergies  Family History: Family History  Problem Relation Age of Onset  . High blood pressure Mother   . Cancer  Father   . Sickle cell trait Sister   . Prostate cancer Neg Hx   . Bladder Cancer Neg Hx   . Kidney cancer Neg Hx     Social History:  reports that he has been smoking Cigars.  He has smoked for the past 20.00 years. He has never used smokeless tobacco. He reports that he drinks alcohol. He reports that he does not use drugs.  ROS: UROLOGY Frequent Urination?: No Hard to postpone urination?: No Burning/pain with urination?: No Get up at night to urinate?: No Leakage of urine?: No Urine stream starts and stops?: No Trouble starting stream?: No Do you have to strain to urinate?: No Blood in urine?: No Urinary tract infection?: No Sexually transmitted disease?: No Injury to kidneys or bladder?: No Painful intercourse?: No Weak stream?: No Erection problems?: Yes Penile pain?: No  Gastrointestinal Nausea?: No Vomiting?: No Indigestion/heartburn?: No Diarrhea?: No Constipation?: No  Constitutional Fever: No Night sweats?: No Weight loss?: No Fatigue?: No  Skin Skin rash/lesions?: No Itching?: No  Eyes Blurred vision?: No Double vision?: No  Ears/Nose/Throat Sore throat?: No Sinus problems?: No  Hematologic/Lymphatic Swollen glands?: No Easy bruising?: No  Cardiovascular Leg swelling?: No Chest pain?: No  Respiratory Cough?: No Shortness of breath?: No  Endocrine Excessive thirst?: No  Musculoskeletal Back pain?: No Joint pain?: No  Neurological Headaches?: No Dizziness?: No  Psychologic Depression?: No Anxiety?: No  Physical Exam: BP (!) 152/99 (BP Location: Right Arm, Patient Position: Sitting, Cuff Size: Normal)   Pulse (!) 118   Ht 6\' 2"  (1.88 m)   Wt 216 lb 3.2 oz (98.1 kg)   BMI 27.76 kg/m   Constitutional:  Alert and oriented, No acute distress. HEENT: [place] AT, moist mucus membranes.  Trachea midline, no masses. Cardiovascular: No clubbing, cyanosis, or edema. Respiratory: Normal respiratory effort, no increased work of  breathing. GI: Abdomen is soft, nontender, nondistended, no abdominal masses GU: No CVA tenderness.  Skin: No rashes, bruises or suspicious lesions. Lymph: No cervical or inguinal adenopathy. Neurologic: Grossly intact, no focal deficits, moving all 4 extremities. Psychiatric: Normal mood and affect.  Laboratory Data: Lab Results  Component Value Date   WBC 7.0 05/26/2015   HGB 15.1 05/26/2015   HCT 42.5 05/26/2015   MCV 100.7 (H) 05/26/2015   PLT 234 05/26/2015    Lab Results  Component Value Date   CREATININE 1.04 05/26/2015    No results found for: PSA  No results found for: TESTOSTERONE  No results found for: HGBA1C  Urinalysis No results found for: COLORURINE, APPEARANCEUR, LABSPEC, PHURINE, GLUCOSEU, HGBUR, BILIRUBINUR, KETONESUR, PROTEINUR, UROBILINOGEN, NITRITE, LEUKOCYTESUR  Procedure: The patient was taught the correct procedure for use of trimix.  0.5 cc was injected into the left corpora with return of a partial erection at about 5 minutes.  Assessment & Plan:    1. Erectile dysfunction The patient was successfully treated with trimix today.  However, since his last visit he has changed his mind and would like to give generic sildenafil to try before further pursuing this therapy.  I have given him a prescription for generic sildenafil.  If this does not work for him, he will contact the office and we will fill his Trimix. He will start at a dose 0.5 cc and can increase by 0.1 cc at a time as needed up to 1 cc.  He will follow-up annually.  He was also warned to return to the office if his erection has not resolved within 4 hours.  2. Prostate cancer screening Up-to-date  Return in about 1 year (around 09/14/2018).  Hildred LaserBrian James Broderick Fonseca, MD  [facility] [address], [address] [place], [place] [address] [phone]

## 2018-09-14 ENCOUNTER — Ambulatory Visit: Payer: 59

## 2020-06-30 ENCOUNTER — Encounter: Payer: Self-pay | Admitting: Ophthalmology

## 2020-07-08 ENCOUNTER — Other Ambulatory Visit
Admission: RE | Admit: 2020-07-08 | Discharge: 2020-07-08 | Disposition: A | Discharge status: Home / Self Care | Payer: 59 | Source: Ambulatory Visit | Attending: Ophthalmology | Admitting: Ophthalmology

## 2020-07-08 ENCOUNTER — Other Ambulatory Visit: Payer: Self-pay

## 2020-07-08 DIAGNOSIS — Z20822 Contact with and (suspected) exposure to covid-19: Secondary | ICD-10-CM | POA: Insufficient documentation

## 2020-07-08 DIAGNOSIS — Z01812 Encounter for preprocedural laboratory examination: Secondary | ICD-10-CM | POA: Diagnosis not present

## 2020-07-09 LAB — SARS CORONAVIRUS 2 (TAT 6-24 HRS): SARS Coronavirus 2: NEGATIVE

## 2020-07-09 NOTE — Discharge Instructions (Signed)

## 2020-07-10 ENCOUNTER — Encounter
Admission: RE | Disposition: A | Discharge status: Home / Self Care | Payer: Self-pay | Source: Home / Self Care | Attending: Ophthalmology

## 2020-07-10 ENCOUNTER — Ambulatory Visit
Admission: RE | Admit: 2020-07-10 | Discharge: 2020-07-10 | Disposition: A | Discharge status: Home / Self Care | Payer: 59 | Attending: Ophthalmology | Admitting: Ophthalmology

## 2020-07-10 ENCOUNTER — Ambulatory Visit: Payer: 59 | Admitting: Anesthesiology

## 2020-07-10 ENCOUNTER — Encounter: Payer: Self-pay | Admitting: Ophthalmology

## 2020-07-10 ENCOUNTER — Other Ambulatory Visit: Payer: Self-pay

## 2020-07-10 DIAGNOSIS — Z79899 Other long term (current) drug therapy: Secondary | ICD-10-CM | POA: Insufficient documentation

## 2020-07-10 DIAGNOSIS — I10 Essential (primary) hypertension: Secondary | ICD-10-CM | POA: Insufficient documentation

## 2020-07-10 DIAGNOSIS — H2511 Age-related nuclear cataract, right eye: Secondary | ICD-10-CM | POA: Diagnosis present

## 2020-07-10 HISTORY — PX: CATARACT EXTRACTION W/PHACO: SHX586

## 2020-07-10 SURGERY — PHACOEMULSIFICATION, CATARACT, WITH IOL INSERTION
Anesthesia: Monitor Anesthesia Care | Site: Eye | Laterality: Right

## 2020-07-10 MED ORDER — MIDAZOLAM HCL 2 MG/2ML IJ SOLN
INTRAMUSCULAR | Status: DC | PRN
  Administered 2020-07-10 (×2): 1 mg via INTRAVENOUS

## 2020-07-10 MED ORDER — FENTANYL CITRATE (PF) 100 MCG/2ML IJ SOLN
INTRAMUSCULAR | Status: DC | PRN
Start: 2020-07-10 — End: 2020-07-10
  Administered 2020-07-10: 50 ug via INTRAVENOUS

## 2020-07-10 MED ORDER — NA HYALUR & NA CHOND-NA HYALUR 0.4-0.35 ML IO KIT
PACK | INTRAOCULAR | Status: DC | PRN
  Administered 2020-07-10: 1 mL via INTRAOCULAR

## 2020-07-10 MED ORDER — LACTATED RINGERS IV SOLN: INTRAVENOUS | Status: DC

## 2020-07-10 MED ORDER — OXYCODONE HCL 5 MG PO TABS: 5.0000 mg | ORAL_TABLET | Freq: Once | ORAL | Status: DC | PRN

## 2020-07-10 MED ORDER — OXYCODONE HCL 5 MG/5ML PO SOLN: 5.0000 mg | Freq: Once | ORAL | Status: DC | PRN

## 2020-07-10 MED ORDER — SODIUM CHLORIDE 0.9 % IV SOLN: INTRAVENOUS | Status: DC

## 2020-07-10 MED ORDER — EPINEPHRINE PF 1 MG/ML IJ SOLN
INTRAOCULAR | Status: DC | PRN
  Administered 2020-07-10: 72 mL via OPHTHALMIC

## 2020-07-10 MED ORDER — TETRACAINE HCL 0.5 % OP SOLN
1.0000 [drp] | OPHTHALMIC | Status: AC | PRN
  Administered 2020-07-10 (×2): 1 [drp] via OPHTHALMIC

## 2020-07-10 MED ORDER — BRIMONIDINE TARTRATE-TIMOLOL 0.2-0.5 % OP SOLN
OPHTHALMIC | Status: DC | PRN
  Administered 2020-07-10: 1 [drp] via OPHTHALMIC

## 2020-07-10 MED ORDER — LIDOCAINE HCL (PF) 2 % IJ SOLN
INTRAOCULAR | Status: DC | PRN
  Administered 2020-07-10: 2 mL

## 2020-07-10 MED ORDER — MOXIFLOXACIN HCL 0.5 % OP SOLN
1.0000 [drp] | OPHTHALMIC | Status: DC | PRN
  Administered 2020-07-10 (×2): 1 [drp] via OPHTHALMIC

## 2020-07-10 MED ORDER — ARMC OPHTHALMIC DILATING DROPS
1.0000 "application " | OPHTHALMIC | Status: AC
  Administered 2020-07-10 (×3): 1 via OPHTHALMIC

## 2020-07-10 MED ORDER — CEFUROXIME OPHTHALMIC INJECTION 1 MG/0.1 ML
INJECTION | OPHTHALMIC | Status: DC | PRN
  Administered 2020-07-10: 0.1 mL via INTRACAMERAL

## 2020-07-10 MED ORDER — MOXIFLOXACIN HCL 0.5 % OP SOLN
1.0000 [drp] | OPHTHALMIC | Status: AC | PRN
  Administered 2020-07-10: 1 [drp] via OPHTHALMIC

## 2020-07-10 SURGICAL SUPPLY — 29 items
CANNULA ANT/CHMB 27G (MISCELLANEOUS) ×1 IMPLANT
CANNULA ANT/CHMB 27GA (MISCELLANEOUS) ×3 IMPLANT
GLOVE SURG LX 7.5 STRW (GLOVE) ×4
GLOVE SURG LX STRL 7.5 STRW (GLOVE) ×1 IMPLANT
GLOVE SURG TRIUMPH 8.0 PF LTX (GLOVE) ×3 IMPLANT
GOWN STRL REUS W/ TWL LRG LVL3 (GOWN DISPOSABLE) ×2 IMPLANT
GOWN STRL REUS W/TWL LRG LVL3 (GOWN DISPOSABLE) ×6
LENS IOL DIOP 18.0 (Intraocular Lens) ×3 IMPLANT
LENS IOL TECNIS MONO 18.0 (Intraocular Lens) IMPLANT
MARKER SKIN DUAL TIP RULER LAB (MISCELLANEOUS) ×3 IMPLANT
NDL CAPSULORHEX 25GA (NEEDLE) ×1 IMPLANT
NDL FILTER BLUNT 18X1 1/2 (NEEDLE) ×2 IMPLANT
NDL RETROBULBAR .5 NSTRL (NEEDLE) IMPLANT
NEEDLE CAPSULORHEX 25GA (NEEDLE) ×3 IMPLANT
NEEDLE FILTER BLUNT 18X 1/2SAF (NEEDLE) ×4
NEEDLE FILTER BLUNT 18X1 1/2 (NEEDLE) ×2 IMPLANT
PACK CATARACT BRASINGTON (MISCELLANEOUS) ×3 IMPLANT
PACK EYE AFTER SURG (MISCELLANEOUS) ×3 IMPLANT
PACK OPTHALMIC (MISCELLANEOUS) ×3 IMPLANT
RING MALYGIN 7.0 (MISCELLANEOUS) IMPLANT
SOLUTION OPHTHALMIC SALT (MISCELLANEOUS) ×3 IMPLANT
SUT ETHILON 10-0 CS-B-6CS-B-6 (SUTURE)
SUT VICRYL  9 0 (SUTURE)
SUT VICRYL 9 0 (SUTURE) IMPLANT
SUTURE EHLN 10-0 CS-B-6CS-B-6 (SUTURE) IMPLANT
SYR 3ML LL SCALE MARK (SYRINGE) ×6 IMPLANT
SYR TB 1ML LUER SLIP (SYRINGE) ×3 IMPLANT
WATER STERILE IRR 250ML POUR (IV SOLUTION) ×3 IMPLANT
WIPE NON LINTING 3.25X3.25 (MISCELLANEOUS) ×3 IMPLANT

## 2020-07-10 NOTE — H&P (Signed)

## 2020-07-10 NOTE — Anesthesia Preprocedure Evaluation (Signed)
Anesthesia Evaluation  Patient identified by MRN, date of birth, ID band Patient awake    Reviewed: NPO status   History of Anesthesia Complications Negative for: history of anesthetic complications  Airway Mallampati: II  TM Distance: >3 FB Neck ROM: full    Dental  (+) Lower Dentures, Missing   Pulmonary neg pulmonary ROS, Current Smoker and Patient abstained from smoking.,    Pulmonary exam normal        Cardiovascular Exercise Tolerance: Good hypertension, Normal cardiovascular exam     Neuro/Psych negative neurological ROS  negative psych ROS   GI/Hepatic negative GI ROS, Neg liver ROS,   Endo/Other  negative endocrine ROS  Renal/GU negative Renal ROS  negative genitourinary   Musculoskeletal  (+) Arthritis ,   Abdominal   Peds  Hematology negative hematology ROS (+)   Anesthesia Other Findings Covid: NEG.  Reproductive/Obstetrics                             Anesthesia Physical Anesthesia Plan  ASA: II  Anesthesia Plan: MAC   Post-op Pain Management:    Induction:   PONV Risk Score and Plan: 1 and TIVA  Airway Management Planned:   Additional Equipment:   Intra-op Plan:   Post-operative Plan:   Informed Consent: I have reviewed the patients History and Physical, chart, labs and discussed the procedure including the risks, benefits and alternatives for the proposed anesthesia with the patient or authorized representative who has indicated his/her understanding and acceptance.       Plan Discussed with: CRNA  Anesthesia Plan Comments:         Anesthesia Quick Evaluation

## 2020-07-10 NOTE — Anesthesia Postprocedure Evaluation (Signed)
Anesthesia Post Note  Patient: [name] [name].  Procedure(s) Performed: CATARACT EXTRACTION PHACO AND INTRAOCULAR LENS PLACEMENT (IOC) RIGHT 9.49 01:07.6 14.0% (Right Eye)     Patient location during evaluation: PACU Anesthesia Type: MAC Level of consciousness: awake and alert Pain management: pain level controlled Vital Signs Assessment: post-procedure vital signs reviewed and stable Respiratory status: spontaneous breathing, nonlabored ventilation, respiratory function stable and patient connected to nasal cannula oxygen Cardiovascular status: stable and blood pressure returned to baseline Postop Assessment: no apparent nausea or vomiting Anesthetic complications: no   No complications documented.  Fidel Levy

## 2020-07-10 NOTE — Op Note (Signed)
LOCATION:  [facility]   PREOPERATIVE DIAGNOSIS:    Nuclear sclerotic cataract right eye. H25.11   POSTOPERATIVE DIAGNOSIS:  Nuclear sclerotic cataract right eye.     PROCEDURE:  Phacoemusification with posterior chamber intraocular lens placement of the right eye   ULTRASOUND TIME: Procedure(s): CATARACT EXTRACTION PHACO AND INTRAOCULAR LENS PLACEMENT (IOC) RIGHT 9.49 01:07.6 14.0% (Right)  LENS:   Implant Name Type Inv. Item Serial No. Manufacturer Lot No. LRB No. Used Action  LENS IOL DIOP 18.0 - [id number] Intraocular Lens LENS IOL DIOP 18.0 [id number] [employer]  Right 1 Implanted         SURGEON:  Deirdre Evener, MD   ANESTHESIA:  Topical with tetracaine drops and 2% Xylocaine jelly, augmented with 1% preservative-free intracameral lidocaine.    COMPLICATIONS:  None.   DESCRIPTION OF PROCEDURE:  The patient was identified in the holding room and transported to the operating room and placed in the supine position under the operating microscope.  The right eye was identified as the operative eye and it was prepped and draped in the usual sterile ophthalmic fashion.   A 1 millimeter clear-corneal paracentesis was made at the 12:00 position.  0.5 ml of preservative-free 1% lidocaine was injected into the anterior chamber. The anterior chamber was filled with Viscoat viscoelastic.  A 2.4 millimeter keratome was used to make a near-clear corneal incision at the 9:00 position.  A curvilinear capsulorrhexis was made with a cystotome and capsulorrhexis forceps.  Balanced salt solution was used to hydrodissect and hydrodelineate the nucleus.   Phacoemulsification was then used in stop and chop fashion to remove the lens nucleus and epinucleus.  The remaining cortex was then removed using the irrigation and aspiration handpiece. Provisc was then placed into the capsular bag to distend it for lens placement.  A lens was then injected into the capsular bag.   The remaining viscoelastic was aspirated.   Wounds were hydrated with balanced salt solution.  The anterior chamber was inflated to a physiologic pressure with balanced salt solution.  No wound leaks were noted. Cefuroxime 0.1 ml of a 10mg /ml solution was injected into the anterior chamber for a dose of 1 mg of intracameral antibiotic at the completion of the case.   Timolol and Brimonidine drops were applied to the eye.  The patient was taken to the recovery room in stable condition without complications of anesthesia or surgery.   Milessa Hogan 07/10/2020, 10:16 AM

## 2020-07-10 NOTE — Transfer of Care (Signed)
Immediate Anesthesia Transfer of Care Note  Patient: [name] [name].  Procedure(s) Performed: CATARACT EXTRACTION PHACO AND INTRAOCULAR LENS PLACEMENT (IOC) RIGHT 9.49 01:07.6 14.0% (Right Eye)  Patient Location: PACU  Anesthesia Type: MAC  Level of Consciousness: awake, alert  and patient cooperative  Airway and Oxygen Therapy: Patient Spontanous Breathing and Patient connected to supplemental oxygen  Post-op Assessment: Post-op Vital signs reviewed, Patient's Cardiovascular Status Stable, Respiratory Function Stable, Patent Airway and No signs of Nausea or vomiting  Post-op Vital Signs: Reviewed and stable  Complications: No complications documented.

## 2020-07-10 NOTE — Anesthesia Procedure Notes (Signed)
Procedure Name: MAC Date/Time: 07/10/2020 9:57 AM Performed by: Cameron Ali, CRNA Pre-anesthesia Checklist: Patient identified, Emergency Drugs available, Suction available, Timeout performed and Patient being monitored Patient Re-evaluated:Patient Re-evaluated prior to induction Oxygen Delivery Method: Nasal cannula Placement Confirmation: positive ETCO2

## 2020-07-11 ENCOUNTER — Encounter: Payer: Self-pay | Admitting: Ophthalmology

## 2021-09-14 ENCOUNTER — Encounter: Payer: Self-pay | Admitting: Dermatology

## 2021-09-14 ENCOUNTER — Other Ambulatory Visit: Payer: Self-pay

## 2021-09-14 ENCOUNTER — Ambulatory Visit: Payer: 59 | Admitting: Dermatology

## 2021-09-14 DIAGNOSIS — L4050 Arthropathic psoriasis, unspecified: Secondary | ICD-10-CM | POA: Diagnosis not present

## 2021-09-14 DIAGNOSIS — L409 Psoriasis, unspecified: Secondary | ICD-10-CM | POA: Diagnosis not present

## 2021-09-14 DIAGNOSIS — Z9889 Other specified postprocedural states: Secondary | ICD-10-CM

## 2021-09-14 MED ORDER — VTAMA 1 % EX CREA: 1.0000 "application " | TOPICAL_CREAM | Freq: Two times a day (BID) | CUTANEOUS | 2 refills | Status: DC

## 2021-09-14 NOTE — Progress Notes (Signed)
   New Patient Visit  Subjective  [name] [name]. is a 65 y.o. male who presents for the following: Psoriasis (Pt c/o psoriasis on the body for several years, pt using otc products with a poor response,  +arthritis right arm ). Hx back surgery from football injuries.  The following portions of the chart were reviewed this encounter and updated as appropriate:   Tobacco  Allergies  Meds  Problems  Med Hx  Surg Hx  Fam Hx     Review of Systems:  No other skin or systemic complaints except as noted in HPI or Assessment and Plan.  Objective  Well appearing patient in no apparent distress; mood and affect are within normal limits.  A focused examination was performed including exts, trunk . Relevant physical exam findings are noted in the Assessment and Plan.  arms, knees, hands, buttock Well-demarcated erythematous papules/plaques with silvery scale, guttate pink scaly papules.               Assessment & Plan  Psoriasis arms, knees, hands, buttock  Psoriasis is a chronic non-curable, but treatable genetic/hereditary disease that may have other systemic features affecting other organ systems such as joints (Psoriatic Arthritis). It is associated with an increased risk of inflammatory bowel disease, heart disease, non-alcoholic fatty liver disease, and depression.     BSA 14%  Start Vtama apply to skin qd-bid prn  May consider systemic treatments in future. May refer to a rheumatologist in the future to evaluate joints / arthritis.   Related Medications Tapinarof (VTAMA) 1 % CREA Apply 1 application topically 2 (two) times daily.  Return in about 6 weeks (around 10/26/2021) for Psoriasis.  IAngelique Holm, CMA, am acting as scribe for Armida Sans, MD .  Documentation: I have reviewed the above documentation for accuracy and completeness, and I agree with the above.  Armida Sans, MD

## 2021-09-14 NOTE — Patient Instructions (Signed)

## 2021-10-26 ENCOUNTER — Ambulatory Visit (INDEPENDENT_AMBULATORY_CARE_PROVIDER_SITE_OTHER): Payer: 59 | Admitting: Dermatology

## 2021-10-26 ENCOUNTER — Other Ambulatory Visit: Payer: Self-pay

## 2021-10-26 DIAGNOSIS — L409 Psoriasis, unspecified: Secondary | ICD-10-CM

## 2021-10-26 MED ORDER — MOMETASONE FUROATE 0.1 % EX CREA: 1.0000 "application " | TOPICAL_CREAM | Freq: Every day | CUTANEOUS | 1 refills | Status: DC | PRN

## 2021-10-26 NOTE — Patient Instructions (Addendum)
Psoriasis is a chronic non-curable, but treatable genetic/hereditary disease that may have other systemic features affecting other organ systems such as joints (Psoriatic Arthritis). It is associated with an increased risk of inflammatory bowel disease, heart disease, non-alcoholic fatty liver disease, and depression.    Continue Vtama once nightly to affected areas. Start mometasone cream once daily to more severe areas up to 4 days a week. Avoid applying to face, groin, and axilla. Use as directed. Long-term use can cause thinning of the skin.  Topical steroids (such as triamcinolone, fluocinolone, fluocinonide, mometasone, clobetasol, halobetasol, betamethasone, hydrocortisone) can cause thinning and lightening of the skin if they are used for too long in the same area. Your physician has selected the right strength medicine for your problem and area affected on the body. Please use your medication only as directed by your physician to prevent side effects.   Recommend CeraVe cream.   If You Need Anything After Your Visit  If you have any questions or concerns for your doctor, please call our main line at [phone] and press option 4 to reach your doctor's medical assistant. If no one answers, please leave a voicemail as directed and we will return your call as soon as possible. Messages left after 4 pm will be answered the following business day.   You may also send [place] a message via MyChart. We typically respond to MyChart messages within 1-2 business days.  For prescription refills, please ask your pharmacy to contact our office. Our fax number is [phone].  If you have an urgent issue when the clinic is closed that cannot wait until the next business day, you can page your doctor at the number below.    Please note that while we do our best to be available for urgent issues outside of office hours, we are not available 24/7.   If you have an urgent issue and are unable to reach [place],  you may choose to seek medical care at your doctor's office, retail clinic, urgent care center, or emergency room.  If you have a medical emergency, please immediately call 911 or go to the emergency department.  Pager Numbers  - Dr. Gwen Pounds: [phone]  - Dr. Neale Burly: [phone]  - Dr. Roseanne Reno: [phone]  In the event of inclement weather, please call our main line at [phone] for an update on the status of any delays or closures.  Dermatology Medication Tips: Please keep the boxes that topical medications come in in order to help keep track of the instructions about where and how to use these. Pharmacies typically print the medication instructions only on the boxes and not directly on the medication tubes.   If your medication is too expensive, please contact our office at [phone] option 4 or send [place] a message through MyChart.   We are unable to tell what your co-pay for medications will be in advance as this is different depending on your insurance coverage. However, we may be able to find a substitute medication at lower cost or fill out paperwork to get insurance to cover a needed medication.   If a prior authorization is required to get your medication covered by your insurance company, please allow [place] 1-2 business days to complete this process.  Drug prices often vary depending on where the prescription is filled and some pharmacies may offer cheaper prices.  The website www.goodrx.com contains coupons for medications through different pharmacies. The prices here do not account for what the cost may be with help  from insurance (it may be cheaper with your insurance), but the website can give you the price if you did not use any insurance.  - You can print the associated coupon and take it with your prescription to the pharmacy.  - You may also stop by our office during regular business hours and pick up a GoodRx coupon card.  - If you need your prescription sent  electronically to a different pharmacy, notify our office through [facility] or by phone at [phone] option 4.     Si Usted Necesita Algo Despus de Su Visita  Tambin puede enviarnos un mensaje a travs de Clinical cytogeneticist. Por lo general respondemos a los mensajes de MyChart en el transcurso de 1 a 2 das hbiles.  Para renovar recetas, por favor pida a su farmacia que se ponga en contacto con nuestra oficina. Annie Sable de fax es [place] [phone].  Si tiene un asunto urgente cuando la clnica est cerrada y que no puede esperar hasta el siguiente da hbil, puede llamar/localizar a su doctor(a) al nmero que aparece a continuacin.   Por favor, tenga en cuenta que aunque hacemos todo lo posible para estar disponibles para asuntos urgentes fuera del horario de [place], no estamos disponibles las 24 horas del da, los 7 [address]  [address] de la [place].   Si tiene un problema urgente y no puede comunicarse con nosotros, puede optar por buscar atencin mdica  en el consultorio de su doctor(a), en una clnica privada, en un centro de atencin urgente o en una sala de emergencias.  Si tiene Engineer, drilling, por favor llame inmediatamente al 911 o vaya a la sala de emergencias.  Nmeros de bper  - Dr. Gwen Pounds: [phone]  - Dra. Moye: [phone]  - Dra. Roseanne Reno: [phone]  En caso de inclemencias del [place], por favor llame a Lacy Duverney principal al [phone] para una actualizacin sobre el [place] de cualquier retraso o cierre.  Consejos para la medicacin en dermatologa: Por favor, guarde las cajas en las que vienen los medicamentos de uso tpico para ayudarle a seguir las instrucciones sobre dnde y cmo usarlos. Las farmacias generalmente imprimen las instrucciones del medicamento slo en las cajas y no directamente en los tubos del [place].   Si su medicamento es muy caro, por favor, pngase en contacto con Rolm Gala llamando al [phone] y presione la  opcin 4 o envenos un mensaje a travs de Clinical cytogeneticist.   No podemos decirle cul ser su copago por los medicamentos por adelantado ya que esto es diferente dependiendo de la cobertura de su seguro. Sin embargo, es posible que podamos encontrar un medicamento sustituto a Audiological scientist un formulario para que el seguro cubra el medicamento que se considera necesario.   Si se requiere una autorizacin previa para que su compaa de seguros [place] su medicamento, por favor permtanos de 1 a 2 das hbiles para completar [address]  Los precios de los medicamentos varan con frecuencia dependiendo del Environmental consultant de dnde se surte la receta y alguna farmacias pueden ofrecer precios ms baratos.  El sitio web www.goodrx.com tiene cupones para medicamentos de Health and safety inspector. Los precios aqu no tienen en cuenta lo que podra costar con la ayuda del seguro (puede ser ms barato con su seguro), pero el sitio web puede darle el precio si no utiliz Tourist information centre manager.  - Puede imprimir el cupn correspondiente y llevarlo con su receta a la farmacia.  - Tambin puede pasar por nuestra oficina durante el horario  de atencin regular y Charity fundraiser una tarjeta de cupones de GoodRx.  - Si necesita que su receta se enve electrnicamente a una farmacia diferente, informe a nuestra oficina a travs de MyChart de [place] o por telfono llamando al [phone] y presione la opcin 4.

## 2021-10-26 NOTE — Progress Notes (Signed)
   Follow-Up Visit   Subjective  [name] [name]. is a 65 y.o. male who presents for the following: Psoriasis (Patient here today for 6 week psoriasis follow up. He is currently using Vtama 3-4 times a day. Patient advises his psoriasis has improved. ).  The following portions of the chart were reviewed this encounter and updated as appropriate:   Tobacco  Allergies  Meds  Problems  Med Hx  Surg Hx  Fam Hx     Review of Systems:  No other skin or systemic complaints except as noted in HPI or Assessment and Plan.  Objective  Well appearing patient in no apparent distress; mood and affect are within normal limits.  A focused examination was performed including hands, elbows. Relevant physical exam findings are noted in the Assessment and Plan.  hands, elbows Thickening and scale at left wrist, other areas at hands and elbows mostly with macular hyperpigmentation, elbows with some mild roughness   Assessment & Plan  Psoriasis -improving on Vtama Patient is pleased with results. Not to goal. hands, elbows  Psoriasis is a chronic non-curable, but treatable genetic/hereditary disease that may have other systemic features affecting other organ systems such as joints (Psoriatic Arthritis). It is associated with an increased risk of inflammatory bowel disease, heart disease, non-alcoholic fatty liver disease, and depression.    Continue Vtama once at bedtime to affected areas of psoriasis Start mometasone cream once daily 4 days a week affected area psoriasis.  Avoid applying to face, groin, and axilla. Use as directed. Long-term use can cause thinning of the skin.  Topical steroids (such as triamcinolone, fluocinolone, fluocinonide, mometasone, clobetasol, halobetasol, betamethasone, hydrocortisone) can cause thinning and lightening of the skin if they are used for too long in the same area. Your physician has selected the right strength medicine for your problem and area affected on  the body. Please use your medication only as directed by your physician to prevent side effects.   mometasone (ELOCON) 0.1 % cream - hands, elbows Apply 1 application topically daily as needed (Rash). Apply once daily to more severe areas 4 days weekly. Avoid applying to face, groin, and axilla. Use as directed. Long-term use can cause thinning of the skin.  Related Medications Tapinarof (VTAMA) 1 % CREA Apply 1 application topically 2 (two) times daily.  Return for Psoriasis in 3-4 months.  Anise Salvo, RMA, am acting as scribe for Armida Sans, MD . Documentation: I have reviewed the above documentation for accuracy and completeness, and I agree with the above.  Armida Sans, MD

## 2021-10-31 ENCOUNTER — Encounter: Payer: Self-pay | Admitting: Dermatology

## 2022-02-03 ENCOUNTER — Ambulatory Visit (INDEPENDENT_AMBULATORY_CARE_PROVIDER_SITE_OTHER): Payer: 59 | Admitting: Dermatology

## 2022-02-03 ENCOUNTER — Other Ambulatory Visit: Payer: Self-pay

## 2022-02-03 DIAGNOSIS — L409 Psoriasis, unspecified: Secondary | ICD-10-CM | POA: Diagnosis not present

## 2022-02-03 MED ORDER — VTAMA 1 % EX CREA: 1.0000 "application " | TOPICAL_CREAM | Freq: Every day | CUTANEOUS | 6 refills | Status: AC

## 2022-02-03 MED ORDER — MOMETASONE FUROATE 0.1 % EX CREA: TOPICAL_CREAM | CUTANEOUS | 6 refills | Status: AC

## 2022-02-03 NOTE — Patient Instructions (Addendum)
Recommend Cerave moisturizer  ? ?Continue vtama 1 % cream apply topically to affected areas daily 7 days week for psoriasis at hands and elbows ?Continue mometasone 0.1 % cream apply topically to affected areas  4 days weekly on Monday - Wednesday - Friday  and Sunday. For psoriasis for hands and elbows  ? ? ? ? ? ? ?If You Need Anything After Your Visit ? ?If you have any questions or concerns for your doctor, please call our main line at [phone] and press option 4 to reach your doctor's medical assistant. If no one answers, please leave a voicemail as directed and we will return your call as soon as possible. Messages left after 4 pm will be answered the following business day.  ? ?You may also send [place] a message via MyChart. We typically respond to MyChart messages within 1-2 business days. ? ?For prescription refills, please ask your pharmacy to contact our office. Our fax number is [phone]. ? ?If you have an urgent issue when the clinic is closed that cannot wait until the next business day, you can page your doctor at the number below.   ? ?Please note that while we do our best to be available for urgent issues outside of office hours, we are not available 24/7.  ? ?If you have an urgent issue and are unable to reach [place], you may choose to seek medical care at your doctor's office, retail clinic, urgent care center, or emergency room. ? ?If you have a medical emergency, please immediately call 911 or go to the emergency department. ? ?Pager Numbers ? ?- Dr. Gwen Pounds: [phone] ? ?- Dr. Neale Burly: [phone] ? ?- Dr. Roseanne Reno: [phone] ? ?In the event of inclement weather, please call our main line at [phone] for an update on the status of any delays or closures. ? ?Dermatology Medication Tips: ?Please keep the boxes that topical medications come in in order to help keep track of the instructions about where and how to use these. Pharmacies typically print the medication instructions only on the  boxes and not directly on the medication tubes.  ? ?If your medication is too expensive, please contact our office at [phone] option 4 or send [place] a message through MyChart.  ? ?We are unable to tell what your co-pay for medications will be in advance as this is different depending on your insurance coverage. However, we may be able to find a substitute medication at lower cost or fill out paperwork to get insurance to cover a needed medication.  ? ?If a prior authorization is required to get your medication covered by your insurance company, please allow [place] 1-2 business days to complete this process. ? ?Drug prices often vary depending on where the prescription is filled and some pharmacies may offer cheaper prices. ? ?The website www.goodrx.com contains coupons for medications through different pharmacies. The prices here do not account for what the cost may be with help from insurance (it may be cheaper with your insurance), but the website can give you the price if you did not use any insurance.  ?- You can print the associated coupon and take it with your prescription to the pharmacy.  ?- You may also stop by our office during regular business hours and pick up a GoodRx coupon card.  ?- If you need your prescription sent electronically to a different pharmacy, notify our office through [facility] or by phone at [phone] option 4. ? ? ? ? ?Si Usted [employer]  Algo Despu?s de Su Visita ? ?Tambi?n puede enviarnos un mensaje a trav?s de MyChart. Por lo general respondemos a los mensajes de MyChart en el transcurso de 1 a 2 d?as h?biles. ? ?Para renovar recetas, por favor pida a su farmacia que se ponga en contacto con nuestra oficina. Nuestro n?mero de fax es el [phone]. ? ?Si tiene un asunto urgente cuando la cl?nica est? cerrada y que no puede esperar hasta el siguiente d?a h?bil, puede llamar/localizar a su doctor(a) al n?mero que aparece a continuaci?n.  ? ?Por favor, tenga en cuenta que  aunque hacemos todo lo posible para estar disponibles para asuntos urgentes fuera del horario de oficina, no estamos disponibles las 24 horas del d?a, los 7 d?as de la semana.  ? ?Si tiene un problema urgente y no puede comunicarse con nosotros, puede optar por buscar atenci?n m?dica  en el consultorio de su doctor(a), en una cl?nica privada, en un centro de atenci?n urgente o en una sala de emergencias. ? ?Si tiene Radio broadcast assistant m?dica, por favor llame inmediatamente al 911 o vaya a la sala de emergencias. ? ?N?meros de b?per ? ?- Dr. Gwen Pounds: [phone] ? ?- Dra. Moye: [phone] ? ?- Dra. Roseanne Reno: [phone] ? ?En caso de inclemencias del tiempo, por favor llame a nuestra l?nea principal al [phone] para una actualizaci?n sobre el estado de cualquier retraso o cierre. ? ?Consejos para la medicaci?n en dermatolog?a: ?Por favor, guarde las cajas en las que vienen los medicamentos de uso t?pico para ayudarle a seguir las instrucciones sobre d?nde y c?mo usarlos. Las farmacias generalmente imprimen las instrucciones del medicamento s?lo en las cajas y no directamente en los tubos del [place].  ? ?Si su medicamento es muy caro, por favor, p?ngase en contacto con Rolm Gala llamando al [phone] y presione la opci?n 4 o env?enos un mensaje a trav?s de MyChart.  ? ?No podemos decirle cu?l ser? su copago por los medicamentos por adelantado ya que esto es diferente dependiendo de la cobertura de su seguro. Sin embargo, es posible que podamos encontrar un medicamento sustituto a Audiological scientist un formulario para que el seguro cubra el medicamento que se considera necesario.  ? ?Si se requiere Neomia Dear autorizaci?n previa para que su compa??a de seguros [place] su medicamento, por favor perm?tanos de 1 a 2 d?as h?biles para completar este proceso. ? ?Los precios de los medicamentos var?an con frecuencia dependiendo del Environmental consultant de d?nde se surte la receta y alguna farmacias pueden ofrecer precios m?s  baratos. ? ?El sitio web www.goodrx.com tiene cupones para medicamentos de Health and safety inspector. Los precios aqu? no tienen en cuenta lo que podr?a costar con la ayuda del seguro (puede ser m?s barato con su seguro), pero el sitio web puede darle el precio si no utiliz? ning?n seguro.  ?- Puede imprimir el cup?n correspondiente y llevarlo con su receta a la farmacia.  ?- Tambi?n puede pasar por nuestra oficina durante el horario de atenci?n regular y recoger una tarjeta de cupones de GoodRx.  ?- Si necesita que su receta se env?e electr?nicamente a Psychiatrist, informe a nuestra oficina a trav?s de MyChart de [place] o por tel?fono llamando al [phone] y presione la opci?n 4. ? ?

## 2022-02-03 NOTE — Progress Notes (Signed)
? ?  Follow-Up Visit ?  ?Subjective  ?[name] [name]. is a 66 y.o. male who presents for the following: Follow-up (Patient here today for psoriasis follow up at elbows and hands. Patient reports while using vtama and mometasone cream he is clear at areas. He request refills of vtama and mometasone. ). ?The patient has spots, moles and lesions to be evaluated, some may be new or changing and the patient has concerns that these could be cancer. ? ?The following portions of the chart were reviewed this encounter and updated as appropriate:  Tobacco  Allergies  Meds  Problems  Med Hx  Surg Hx  Fam Hx   ?  ?Review of Systems: No other skin or systemic complaints except as noted in HPI or Assessment and Plan. ? ?Objective  ?Well appearing patient in no apparent distress; mood and affect are within normal limits. ? ?A focused examination was performed including bilateral elbows and bilateral hands. Relevant physical exam findings are noted in the Assessment and Plan. ? ?elbows and bilateral hands ?Improved  ? ? ?Assessment & Plan  ?Psoriasis ?elbows and bilateral hands ?Chronic and persistent condition with duration or expected duration over one year. Condition is symptomatic / bothersome to patient. Not to goal but improved and patient is pleased with his current treatment. ?Psoriasis is a chronic non-curable, but treatable genetic/hereditary disease that may have other systemic features affecting other organ systems such as joints (Psoriatic Arthritis). It is associated with an increased risk of inflammatory bowel disease, heart disease, non-alcoholic fatty liver disease, and depression.   ? ?Continue Vtama 1 % cream - apply topically to affected areas 7 days weekly for psoriasis at hands and elbows.  ?Rx sent to [facility] ? ?Continue mometasone 0.1 % cream - apply topically to affected areas 4 days weekly (Monday, Wednesday, Friday and Sunday for psoriasis)  ?Avoid applying to face, groin, and axilla. Use as  directed. Long-term use can cause thinning of the skin. ? ?Topical steroids (such as triamcinolone, fluocinolone, fluocinonide, mometasone, clobetasol, halobetasol, betamethasone, hydrocortisone) can cause thinning and lightening of the skin if they are used for too long in the same area. Your physician has selected the right strength medicine for your problem and area affected on the body. Please use your medication only as directed by your physician to prevent side effects.  ? ?Tapinarof (VTAMA) 1 % CREA - elbows and bilateral hands ?Apply 1 application. topically daily. Apply 7 days weekly for psoriasis at hands and elbows ? ?mometasone (ELOCON) 0.1 % cream - elbows and bilateral hands ?Apply topically to affected areas 4 days weekly (Monday, Wednesday,Friday and Sunday) for psoriasis  Avoid applying to face, groin, and axilla. Use as directed. Long-term use can cause thinning of the skin. ? ?Return for 6 month psoriasis follow up . ?Geralynn Rile, CMA, am acting as scribe for Armida Sans, MD. ?Documentation: I have reviewed the above documentation for accuracy and completeness, and I agree with the above. ? ?Armida Sans, MD ? ?

## 2022-02-07 ENCOUNTER — Encounter: Payer: Self-pay | Admitting: Dermatology

## 2022-03-03 ENCOUNTER — Telehealth: Payer: Self-pay

## 2022-03-03 NOTE — Telephone Encounter (Signed)
Patient called to say that his psoriasis is clearing up with the cream but he is having some arthritis pain where is he using the Vtama and Mometasone cream. ?

## 2022-03-04 NOTE — Telephone Encounter (Signed)
I left a detailed message on his voicemail and advised him to call the office to let us know how he would like to proceed.

## 2022-08-16 ENCOUNTER — Ambulatory Visit: Payer: 59 | Admitting: Dermatology

## 2023-06-15 ENCOUNTER — Ambulatory Visit: Payer: Medicare HMO

## 2023-06-15 DIAGNOSIS — D123 Benign neoplasm of transverse colon: Secondary | ICD-10-CM

## 2023-06-15 DIAGNOSIS — K5730 Diverticulosis of large intestine without perforation or abscess without bleeding: Secondary | ICD-10-CM

## 2023-06-15 DIAGNOSIS — Z86010 Personal history of colonic polyps: Secondary | ICD-10-CM | POA: Diagnosis not present

## 2023-06-15 DIAGNOSIS — D122 Benign neoplasm of ascending colon: Secondary | ICD-10-CM

## 2023-06-15 DIAGNOSIS — Z09 Encounter for follow-up examination after completed treatment for conditions other than malignant neoplasm: Secondary | ICD-10-CM

## 2023-06-15 DIAGNOSIS — K640 First degree hemorrhoids: Secondary | ICD-10-CM

## 2023-12-06 DIAGNOSIS — M79671 Pain in right foot: Secondary | ICD-10-CM | POA: Diagnosis not present

## 2023-12-06 DIAGNOSIS — M2011 Hallux valgus (acquired), right foot: Secondary | ICD-10-CM | POA: Diagnosis not present

## 2023-12-06 DIAGNOSIS — M21621 Bunionette of right foot: Secondary | ICD-10-CM | POA: Diagnosis not present

## 2023-12-06 DIAGNOSIS — G629 Polyneuropathy, unspecified: Secondary | ICD-10-CM | POA: Diagnosis not present

## 2023-12-06 DIAGNOSIS — R7303 Prediabetes: Secondary | ICD-10-CM | POA: Diagnosis not present

## 2024-01-04 DIAGNOSIS — N1831 Chronic kidney disease, stage 3a: Secondary | ICD-10-CM | POA: Diagnosis not present

## 2024-01-04 DIAGNOSIS — I129 Hypertensive chronic kidney disease with stage 1 through stage 4 chronic kidney disease, or unspecified chronic kidney disease: Secondary | ICD-10-CM | POA: Diagnosis not present

## 2024-01-04 DIAGNOSIS — Z1331 Encounter for screening for depression: Secondary | ICD-10-CM | POA: Diagnosis not present

## 2024-01-04 DIAGNOSIS — R7303 Prediabetes: Secondary | ICD-10-CM | POA: Diagnosis not present

## 2024-01-04 DIAGNOSIS — Z0000 Encounter for general adult medical examination without abnormal findings: Secondary | ICD-10-CM | POA: Diagnosis not present

## 2024-01-09 DIAGNOSIS — R2681 Unsteadiness on feet: Secondary | ICD-10-CM | POA: Diagnosis not present

## 2024-01-09 DIAGNOSIS — G6289 Other specified polyneuropathies: Secondary | ICD-10-CM | POA: Diagnosis not present

## 2024-01-09 DIAGNOSIS — R2689 Other abnormalities of gait and mobility: Secondary | ICD-10-CM | POA: Diagnosis not present

## 2024-02-27 DIAGNOSIS — H43813 Vitreous degeneration, bilateral: Secondary | ICD-10-CM | POA: Diagnosis not present

## 2024-02-27 DIAGNOSIS — Z961 Presence of intraocular lens: Secondary | ICD-10-CM | POA: Diagnosis not present

## 2024-02-27 DIAGNOSIS — Z0100 Encounter for examination of eyes and vision without abnormal findings: Secondary | ICD-10-CM | POA: Diagnosis not present

## 2024-07-04 DIAGNOSIS — R7303 Prediabetes: Secondary | ICD-10-CM | POA: Diagnosis not present

## 2024-07-04 DIAGNOSIS — Z125 Encounter for screening for malignant neoplasm of prostate: Secondary | ICD-10-CM | POA: Diagnosis not present

## 2024-07-04 DIAGNOSIS — N1831 Chronic kidney disease, stage 3a: Secondary | ICD-10-CM | POA: Diagnosis not present

## 2024-07-04 DIAGNOSIS — I129 Hypertensive chronic kidney disease with stage 1 through stage 4 chronic kidney disease, or unspecified chronic kidney disease: Secondary | ICD-10-CM | POA: Diagnosis not present

## 2024-09-27 DIAGNOSIS — M79641 Pain in right hand: Secondary | ICD-10-CM | POA: Diagnosis not present

## 2024-09-27 DIAGNOSIS — M19041 Primary osteoarthritis, right hand: Secondary | ICD-10-CM | POA: Diagnosis not present

## 2024-09-27 DIAGNOSIS — M7989 Other specified soft tissue disorders: Secondary | ICD-10-CM | POA: Diagnosis not present
